# Patient Record
Sex: Female | Born: 1995 | Hispanic: Yes | Smoking: Former smoker
Health system: Southern US, Community
[De-identification: ages and names within clinical notes are randomized; demographics above are authoritative.]

## PROBLEM LIST (undated history)

## (undated) ENCOUNTER — Inpatient Hospital Stay (HOSPITAL_COMMUNITY): Payer: Self-pay

## (undated) DIAGNOSIS — F99 Mental disorder, not otherwise specified: Secondary | ICD-10-CM

## (undated) DIAGNOSIS — F329 Major depressive disorder, single episode, unspecified: Secondary | ICD-10-CM

## (undated) DIAGNOSIS — R51 Headache: Secondary | ICD-10-CM

## (undated) DIAGNOSIS — F32A Depression, unspecified: Secondary | ICD-10-CM

## (undated) DIAGNOSIS — E119 Type 2 diabetes mellitus without complications: Secondary | ICD-10-CM

## (undated) DIAGNOSIS — E669 Obesity, unspecified: Secondary | ICD-10-CM

## (undated) DIAGNOSIS — H18602 Keratoconus, unspecified, left eye: Secondary | ICD-10-CM

## (undated) DIAGNOSIS — F419 Anxiety disorder, unspecified: Secondary | ICD-10-CM

## (undated) DIAGNOSIS — R519 Headache, unspecified: Secondary | ICD-10-CM

---
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
## 2013-10-06 ENCOUNTER — Emergency Department (HOSPITAL_COMMUNITY)
Admission: EM | Admit: 2013-10-06 | Discharge: 2013-10-06 | Disposition: A | Discharge status: Home / Self Care | Payer: Medicaid Other | Attending: Emergency Medicine | Admitting: Emergency Medicine

## 2013-10-06 ENCOUNTER — Encounter (HOSPITAL_COMMUNITY): Payer: Self-pay | Admitting: Emergency Medicine

## 2013-10-06 DIAGNOSIS — F329 Major depressive disorder, single episode, unspecified: Secondary | ICD-10-CM | POA: Diagnosis not present

## 2013-10-06 DIAGNOSIS — F411 Generalized anxiety disorder: Secondary | ICD-10-CM | POA: Diagnosis not present

## 2013-10-06 DIAGNOSIS — Z79899 Other long term (current) drug therapy: Secondary | ICD-10-CM | POA: Insufficient documentation

## 2013-10-06 DIAGNOSIS — L600 Ingrowing nail: Secondary | ICD-10-CM | POA: Insufficient documentation

## 2013-10-06 DIAGNOSIS — M79609 Pain in unspecified limb: Secondary | ICD-10-CM | POA: Diagnosis present

## 2013-10-06 DIAGNOSIS — L089 Local infection of the skin and subcutaneous tissue, unspecified: Secondary | ICD-10-CM

## 2013-10-06 DIAGNOSIS — F3289 Other specified depressive episodes: Secondary | ICD-10-CM | POA: Insufficient documentation

## 2013-10-06 DIAGNOSIS — E669 Obesity, unspecified: Secondary | ICD-10-CM | POA: Diagnosis not present

## 2013-10-06 MED ORDER — CEPHALEXIN 500 MG PO CAPS: 500.0000 mg | ORAL_CAPSULE | Freq: Four times a day (QID) | ORAL | Status: DC

## 2013-10-06 NOTE — ED Provider Notes (Signed)
CSN: [id number]     Arrival date & time 10/06/13  1124 History  This chart was scribed for non-physician practitioner, Rhea Bleacher, PA-C working with Flint Melter, MD by Greggory Stallion, ED scribe. This patient was seen in room TR06C/TR06C and the patient's care was started at 11:58 AM.   Chief Complaint  Patient presents with  . Toe Pain   The history is provided by the patient. No language interpreter was used.   HPI Comments: [name] is a 18 y.o. female who presents to the Emergency Department complaining of worsening right big toe pain with associated swelling and redness that started 3 months ago. States she thought she had an ingrown nail and tried to cut it out but cut her foot instead. Reports pus drainage from the area. Bearing weight worsens the pain.   Past Medical History  Diagnosis Date  . Mental disorder   . Anxiety   . Depression   . Obesity    Past Surgical History  Procedure Laterality Date  . Wisdom tooth extraction  Oct 2012    all four   Family History  Problem Relation Age of Onset  . Bipolar disorder Maternal Grandmother    History  Substance Use Topics  . Smoking status: Never Smoker   . Smokeless tobacco: Not on file  . Alcohol Use: No   OB History   Grav Para Term Preterm Abortions TAB SAB Ect Mult Living                 Review of Systems  Constitutional: Negative for fever.  HENT: Negative for congestion.   Eyes: Negative for redness.  Respiratory: Negative for shortness of breath.   Cardiovascular: Negative for chest pain.  Gastrointestinal: Negative for abdominal distention.  Musculoskeletal: Positive for arthralgias and joint swelling.  Skin: Positive for color change.  Neurological: Negative for speech difficulty.  Psychiatric/Behavioral: Negative for confusion.   Allergies  Clindamycin/lincomycin  Home Medications   Prior to Admission medications   Medication Sig Start Date End Date Taking? Authorizing Provider   buPROPion (WELLBUTRIN XL) 300 MG 24 hr tablet Take 300 mg by mouth daily.    Historical Provider, MD  FLUoxetine (PROZAC) 20 MG capsule Take 20 mg by mouth daily.    Historical Provider, MD  ondansetron (ZOFRAN) 4 MG tablet Take 1 tablet (4 mg total) by mouth every 6 (six) hours. 05/09/12   Trixie Dredge, PA-C   BP 112/61  Pulse 86  Temp(Src) 98.4 F (36.9 C) (Oral)  Resp 15  Ht  (1.651 m)  Wt 176 lb (79.833 kg)  BMI 29.29 kg/m2  SpO2 100%  LMP 09/06/2013  Physical Exam  Nursing note and vitals reviewed. Constitutional: She is oriented to person, place, and time. She appears well-developed and well-nourished. No distress.  HENT:  Head: Normocephalic and atraumatic.  Eyes: Conjunctivae and EOM are normal.  Neck: Neck supple. No tracheal deviation present.  Cardiovascular: Normal rate.   Pulmonary/Chest: Effort normal. No respiratory distress.  Musculoskeletal: Normal range of motion.  Mild erythema and redness of medial nailfold, no paronychia. No active drainage. Toenail appears ingrown.   Neurological: She is alert and oriented to person, place, and time.  Skin: Skin is warm and dry.  Psychiatric: She has a normal mood and affect. Her behavior is normal.    ED Course  Procedures (including critical care time)  DIAGNOSTIC STUDIES: Oxygen Saturation is 100% on RA, normal by my interpretation.    COORDINATION OF  CARE: 12:00 PM-Discussed treatment plan which includes an antibiotic with pt at bedside and pt agreed to plan. Will give pt a podiatry referral and advised her to follow up for excision.   Labs Review Labs Reviewed - No data to display  Imaging Review No results found.   EKG Interpretation None      Vital signs reviewed and are as follows: Filed Vitals:   10/06/13 1145  BP: 112/61  Pulse: 86  Temp: 98.4 F (36.9 C)  Resp: 15   12:33 PM Pt urged to return with worsening pain, worsening swelling, expanding area of redness or streaking up extremity,  fever, or any other concerns. Urged to take complete course of antibiotics as prescribed. Pt verbalizes understanding and agrees with plan.   MDM   Final diagnoses:  Ingrown toenail  Toe infection   Patient with infection due to ingrown toenail. Will defer excision to podiatry. Will start on Keflex for skin infection. Infection is mild. Toe is neurovascularly intact.  I personally performed the services described in this documentation, which was scribed in my presence. The recorded information has been reviewed and is accurate.  Renne Crigler, PA-C 10/06/13 1233

## 2013-10-06 NOTE — ED Provider Notes (Signed)
Medical screening examination/treatment/procedure(s) were performed by non-physician practitioner and as supervising physician I was immediately available for consultation/collaboration.   EKG Interpretation None       Flint Melter, MD 10/06/13 2021

## 2013-10-06 NOTE — ED Notes (Signed)
Right great toe pain x 3 months. States has been trying to "cut it out".

## 2013-10-06 NOTE — Discharge Instructions (Signed)
Please read and follow all provided instructions.  Your diagnoses today include:  1. Ingrown toenail   2. Toe infection    Tests performed today include:  Vital signs. See below for your results today.   Medications prescribed:   Keflex (cephalexin) - antibiotic  You have been prescribed an antibiotic medicine: take the entire course of medicine even if you are feeling better. Stopping early can cause the antibiotic not to work.  Take any prescribed medications only as directed.   Home care instructions:  Follow any educational materials contained in this packet. Keep affected area above the level of your heart when possible. Wash area gently twice a day with warm soapy water. Do not apply alcohol or hydrogen peroxide. Cover the area if it draining or weeping.   Follow-up instructions: Please follow-up with your primary care provider or podiatry referral in the next 1 week for further evaluation of your symptoms.   Return instructions:  Return to the Emergency Department if you have:  Fever  Worsening symptoms  Worsening pain  Worsening swelling  Redness of the skin that moves away from the affected area, especially if it streaks away from the affected area   Any other emergent concerns  Your vital signs today were: BP 112/61   Pulse 86   Temp(Src) 98.4 F (36.9 C) (Oral)   Resp 15   Ht  (1.651 m)   Wt 176 lb (79.833 kg)   BMI 29.29 kg/m2   SpO2 100%   LMP 09/06/2013 If your blood pressure (BP) was elevated above 135/85 this visit, please have this repeated by your doctor within one month. --------------

## 2013-12-21 ENCOUNTER — Encounter: Payer: Self-pay | Admitting: Podiatry

## 2013-12-21 ENCOUNTER — Ambulatory Visit (INDEPENDENT_AMBULATORY_CARE_PROVIDER_SITE_OTHER): Payer: Self-pay | Admitting: Podiatry

## 2013-12-21 VITALS — BP 108/70 | HR 80 | Resp 12

## 2013-12-21 DIAGNOSIS — L03011 Cellulitis of right finger: Secondary | ICD-10-CM

## 2013-12-21 DIAGNOSIS — L03039 Cellulitis of unspecified toe: Secondary | ICD-10-CM

## 2013-12-21 MED ORDER — CEPHALEXIN 500 MG PO CAPS: 500.0000 mg | ORAL_CAPSULE | Freq: Three times a day (TID) | ORAL | Status: DC

## 2013-12-21 NOTE — Progress Notes (Signed)
Subjective:    Patient ID: [name], female    DOB: 03/02/1995, 18 y.o.   MRN: [id number]  HPI 18 year old female presents the office today with her mother for complaints of right leg toe ingrown toenail and infection. The patient states that she has had recurrent infections over the last 6 months for which she has been on an antibiotic although she is unsure which one. She is also been applying Neosporin over the area. She states that she has tenderness directly over the nail border particularly with pressure in shoe gear. The patient is unable to wear shoes due to the pain. Denies any systemic complaints as fevers, chills, nausea, vomiting. Not currently on any antibiotics. No other complaints at this time.  Review of Systems  Constitutional: Positive for appetite change and unexpected weight change.  HENT: Positive for sinus pressure and sneezing.   Eyes: Positive for itching and visual disturbance.  Allergic/Immunologic: Positive for environmental allergies and food allergies.  Psychiatric/Behavioral: Positive for hallucinations and behavioral problems. The patient is nervous/anxious.        Objective:   Physical Exam AAO x3, NAD DP/PT pulses palpable bilaterally, CRT less than 3 seconds Protective sensation intact with Simms Weinstein monofilament, vibratory sensation intact, Achilles tendon reflex intact Tenderness palpation over the medial aspect of the right hallux nail. There is overlying edema, erythema over this area. There is evidence of some dried blood within the nail border. There is no ascending cellulitis. No areas of fluctuance or crepitus. There is no areas of pinpoint bony tenderness or pain with vibratory sensation. MMT 5/5, ROM WNL No calf pain with compression, swelling, warmth, erythema. No open lesions.       Assessment & Plan:  18 year old female with a right medial hallux nail border paronychia. -Treatment options discussed including alternatives,  risks, complications. -At this time due to infection in the medial border of the right hallux nail recommend partial nail avulsion. Risks and complications were discussed with the patient and her mother for which they both understood and the patient wishes to proceed with the procedure and verbal consent was obtained. Under sterile conditions a total of 2.5 mL of a one-to-one mixture of 2% lidocaine plain and 0.5% Marcaine plain was infiltrated in a hallux block fashion on the right foot. Once anesthetized the skin was then prepped in a sterile fashion. A tourniquet was then applied. Next the medial border of the right hallux nail was sharply excised mixture to remove the entire offending nail border. There is noted to be a large amount of ingrowing within the knee medial nail border. There is noted to be a small amount of purulence identified to the initial procedure. Once the nail was removed the underlying skin was intact, and no further purulence was identified. The area was then irrigated. Silvadene was applied followed by a dry sterile dressing. After application a dressing the tourniquet was removed and there is noted to be an immediate capillary refill time to the digit. The patient tolerated the procedure well without any complications. -Post procedure instructions were discussed with the patient for which she verbally understood. Written instructions were also provided. -Prescribed Keflex. -Monitor for any clinical signs or symptoms of worsening infection and directed to call the office immediately if any are to occur or go directly to the emergency room. -Follow-up in one week. In the meantime call the office with any questions, concerns, change in symptoms. Discussed with the patient that if she has any recurrence of  pain or if she wishes to proceed with continued with chemical matricectomy to help prevent recurrence once the infection clears.

## 2013-12-21 NOTE — Patient Instructions (Signed)
Betadine Soak Instructions  Purchase an 8 oz. bottle of BETADINE solution (Povidone)  THE DAY AFTER THE PROCEDURE  Place 1 tablespoon of betadine solution in a quart of warm tap water.  Submerge your foot or feet with outer bandage intact for the initial soak; this will allow the bandage to become moist and wet for easy lift off.  Once you remove your bandage, continue to soak in the solution for 20 minutes.  This soak should be done twice a day.  Next, remove your foot or feet from solution, blot dry the affected area and cover.  You may use a band aid large enough to cover the area or use gauze and tape.  Apply other medications to the area as directed by the doctor such as cortisporin otic solution (ear drops) or neosporin.  IF YOUR SKIN BECOMES IRRITATED WHILE USING THESE INSTRUCTIONS, IT IS OKAY TO SWITCH TO EPSOM SALTS AND WATER OR WHITE VINEGAR AND WATER.  Monitor for any signs/symptoms of infection. Call the office immediately if any occur or go directly to the emergency room. Call with any questions/concerns.  

## 2014-01-06 ENCOUNTER — Ambulatory Visit: Payer: Self-pay | Admitting: Podiatry

## 2014-02-03 HISTORY — PX: CORNEAL TRANSPLANT: SHX108

## 2015-04-04 ENCOUNTER — Emergency Department (HOSPITAL_COMMUNITY): Payer: Medicaid Other

## 2015-04-04 ENCOUNTER — Emergency Department (HOSPITAL_COMMUNITY)
Admission: EM | Admit: 2015-04-04 | Discharge: 2015-04-04 | Disposition: A | Discharge status: Home / Self Care | Payer: Medicaid Other | Attending: Physician Assistant | Admitting: Physician Assistant

## 2015-04-04 ENCOUNTER — Encounter (HOSPITAL_COMMUNITY): Payer: Self-pay | Admitting: Family Medicine

## 2015-04-04 DIAGNOSIS — E669 Obesity, unspecified: Secondary | ICD-10-CM | POA: Diagnosis not present

## 2015-04-04 DIAGNOSIS — Z8659 Personal history of other mental and behavioral disorders: Secondary | ICD-10-CM | POA: Diagnosis not present

## 2015-04-04 DIAGNOSIS — Z3202 Encounter for pregnancy test, result negative: Secondary | ICD-10-CM | POA: Insufficient documentation

## 2015-04-04 DIAGNOSIS — R112 Nausea with vomiting, unspecified: Secondary | ICD-10-CM | POA: Insufficient documentation

## 2015-04-04 DIAGNOSIS — J069 Acute upper respiratory infection, unspecified: Secondary | ICD-10-CM | POA: Diagnosis not present

## 2015-04-04 DIAGNOSIS — R509 Fever, unspecified: Secondary | ICD-10-CM | POA: Diagnosis present

## 2015-04-04 DIAGNOSIS — R110 Nausea: Secondary | ICD-10-CM

## 2015-04-04 DIAGNOSIS — R059 Cough, unspecified: Secondary | ICD-10-CM

## 2015-04-04 DIAGNOSIS — R05 Cough: Secondary | ICD-10-CM

## 2015-04-04 LAB — RAPID STREP SCREEN (MED CTR MEBANE ONLY): STREPTOCOCCUS, GROUP A SCREEN (DIRECT): NEGATIVE

## 2015-04-04 LAB — URINALYSIS, ROUTINE W REFLEX MICROSCOPIC
Bilirubin Urine: NEGATIVE
Glucose, UA: NEGATIVE mg/dL
Hgb urine dipstick: NEGATIVE
Ketones, ur: NEGATIVE mg/dL
Nitrite: NEGATIVE
Protein, ur: NEGATIVE mg/dL
Specific Gravity, Urine: 1.017 (ref 1.005–1.030)
pH: 8.5 — ABNORMAL HIGH (ref 5.0–8.0)

## 2015-04-04 LAB — CBC
HCT: 39.8 % (ref 36.0–46.0)
HEMOGLOBIN: 13.2 g/dL (ref 12.0–15.0)
MCH: 29.1 pg (ref 26.0–34.0)
MCHC: 33.2 g/dL (ref 30.0–36.0)
MCV: 87.9 fL (ref 78.0–100.0)
Platelets: 278 10*3/uL (ref 150–400)
RBC: 4.53 MIL/uL (ref 3.87–5.11)
RDW: 12.6 % (ref 11.5–15.5)
WBC: 6.2 10*3/uL (ref 4.0–10.5)

## 2015-04-04 LAB — COMPREHENSIVE METABOLIC PANEL
ALK PHOS: 57 U/L (ref 38–126)
ALT: 20 U/L (ref 14–54)
ANION GAP: 9 (ref 5–15)
AST: 18 U/L (ref 15–41)
Albumin: 3.7 g/dL (ref 3.5–5.0)
BUN: <5 mg/dL — ABNORMAL LOW (ref 6–20)
CALCIUM: 8.8 mg/dL — AB (ref 8.9–10.3)
CHLORIDE: 106 mmol/L (ref 101–111)
CO2: 25 mmol/L (ref 22–32)
Creatinine, Ser: 0.55 mg/dL (ref 0.44–1.00)
GFR calc non Af Amer: >60 mL/min (ref >60)
Glucose, Bld: 100 mg/dL — ABNORMAL HIGH (ref 65–99)
Potassium: 3.9 mmol/L (ref 3.5–5.1)
SODIUM: 140 mmol/L (ref 135–145)
Total Bilirubin: 0.1 mg/dL — ABNORMAL LOW (ref 0.3–1.2)
Total Protein: 7.1 g/dL (ref 6.5–8.1)

## 2015-04-04 LAB — I-STAT BETA HCG BLOOD, ED (MC, WL, AP ONLY): I-stat hCG, quantitative: <5 m[IU]/mL (ref <5)

## 2015-04-04 LAB — URINE MICROSCOPIC-ADD ON

## 2015-04-04 MED ORDER — ONDANSETRON 4 MG PO TBDP: 4.0000 mg | ORAL_TABLET | Freq: Three times a day (TID) | ORAL | Status: DC | PRN

## 2015-04-04 MED ORDER — SODIUM CHLORIDE 0.9 % IV BOLUS (SEPSIS)
1000.0000 mL | Freq: Once | INTRAVENOUS | Status: AC
  Administered 2015-04-04: 1000 mL via INTRAVENOUS

## 2015-04-04 MED ORDER — ONDANSETRON 4 MG PO TBDP
4.0000 mg | ORAL_TABLET | Freq: Once | ORAL | Status: AC | PRN
  Administered 2015-04-04: 4 mg via ORAL

## 2015-04-04 MED ORDER — ONDANSETRON 4 MG PO TBDP
ORAL_TABLET | ORAL | Status: AC
  Filled 2015-04-04: qty 1

## 2015-04-04 MED ORDER — KETOROLAC TROMETHAMINE 30 MG/ML IJ SOLN
30.0000 mg | Freq: Once | INTRAMUSCULAR | Status: AC
  Administered 2015-04-04: 30 mg via INTRAVENOUS
  Filled 2015-04-04: qty 1

## 2015-04-04 NOTE — ED Provider Notes (Signed)
CSN: [id number]     Arrival date & time 04/04/15  1008 History   First MD Initiated Contact with Patient 04/04/15 1239     Chief Complaint  Patient presents with  . Nausea  . Emesis  . Fever    HPI   [name] is a 20 y.o. female with a PMH of anxiety and depression who presents to the ED with fever, chills, nasal congestion, productive cough, and generalized body aches. She states her symptoms started Monday and have been constant since that time. She denies exacerbating factors. She notes she has tried OTC cough medication with no significant symptom relief. She reports sick contact, and states her family members have experienced similar symptoms. She also reports nausea. She denies abdominal pain, vomiting, diarrhea, constipation, dysuria, urgency, frequency.   Past Medical History  Diagnosis Date  . Mental disorder   . Anxiety   . Depression   . Obesity    Past Surgical History  Procedure Laterality Date  . Wisdom tooth extraction  Oct 2012    all four   Family History  Problem Relation Age of Onset  . Bipolar disorder Maternal Grandmother    Social History  Substance Use Topics  . Smoking status: Never Smoker   . Smokeless tobacco: None  . Alcohol Use: No   OB History    No data available      Review of Systems  Constitutional: Positive for fever and chills.  HENT: Positive for congestion.   Respiratory: Positive for cough.   Gastrointestinal: Positive for nausea. Negative for vomiting, abdominal pain, diarrhea, constipation and blood in stool.  Genitourinary: Negative for dysuria, urgency and frequency.  All other systems reviewed and are negative.     Allergies  Clindamycin/lincomycin  Home Medications   Prior to Admission medications   Medication Sig Start Date End Date Taking? Authorizing Provider  acetaminophen (TYLENOL) 325 MG tablet Take 650 mg by mouth every 6 (six) hours as needed for mild pain.   Yes Historical Provider, MD  cephALEXin  (KEFLEX) 500 MG capsule Take 1 capsule (500 mg total) by mouth 4 (four) times daily. Patient not taking: Reported on 04/04/2015 10/06/13   Renne Crigler, PA-C  cephALEXin (KEFLEX) 500 MG capsule Take 1 capsule (500 mg total) by mouth 3 (three) times daily. Patient not taking: Reported on 04/04/2015 12/21/13   Vivi Barrack, DPM  ondansetron (ZOFRAN ODT) 4 MG disintegrating tablet Take 1 tablet (4 mg total) by mouth every 8 (eight) hours as needed for nausea. 04/04/15   Mady Gemma, PA-C  ondansetron (ZOFRAN) 4 MG tablet Take 1 tablet (4 mg total) by mouth every 6 (six) hours. Patient not taking: Reported on 04/04/2015 05/09/12   Trixie Dredge, PA-C    BP 125/77 mmHg  Pulse 106  Temp(Src) 99.1 F (37.3 C) (Oral)  Resp 18  SpO2 99%  LMP 03/21/2015 Physical Exam  Constitutional: She is oriented to person, place, and time. She appears well-developed and well-nourished. No distress.  HENT:  Head: Normocephalic and atraumatic.  Right Ear: External ear normal.  Left Ear: External ear normal.  Nose: Nose normal.  Mouth/Throat: Uvula is midline, oropharynx is clear and moist and mucous membranes are normal. No oropharyngeal exudate, posterior oropharyngeal edema, posterior oropharyngeal erythema or tonsillar abscesses.  Eyes: Conjunctivae, EOM and lids are normal. Pupils are equal, round, and reactive to light. Right eye exhibits no discharge. Left eye exhibits no discharge. No scleral icterus.  Neck: Normal range of motion.  Neck supple.  Cardiovascular: Normal rate, regular rhythm, normal heart sounds, intact distal pulses and normal pulses.   Pulmonary/Chest: Effort normal and breath sounds normal. No respiratory distress. She has no wheezes. She has no rales.  Abdominal: Soft. Normal appearance and bowel sounds are normal. She exhibits no distension and no mass. There is no tenderness. There is no rigidity, no rebound and no guarding.  Musculoskeletal: Normal range of motion. She exhibits no  edema or tenderness.  Neurological: She is alert and oriented to person, place, and time. She has normal strength. No sensory deficit.  Skin: Skin is warm, dry and intact. No rash noted. She is not diaphoretic. No erythema. No pallor.  Psychiatric: She has a normal mood and affect. Her speech is normal and behavior is normal.  Nursing note and vitals reviewed.   ED Course  Procedures (including critical care time)  Labs Review Labs Reviewed  COMPREHENSIVE METABOLIC PANEL - Abnormal; Notable for the following:    Glucose, Bld 100 (*)    BUN <5 (*)    Calcium 8.8 (*)    Total Bilirubin 0.1 (*)    All other components within normal limits  URINALYSIS, ROUTINE W REFLEX MICROSCOPIC (NOT AT [facility]) - Abnormal; Notable for the following:    pH 8.5 (*)    Leukocytes, UA TRACE (*)    All other components within normal limits  URINE MICROSCOPIC-ADD ON - Abnormal; Notable for the following:    Squamous Epithelial / LPF 0-5 (*)    Bacteria, UA FEW (*)    All other components within normal limits  RAPID STREP SCREEN (NOT AT [facility])  CULTURE, GROUP A STREP [facility])  CBC  I-STAT BETA HCG BLOOD, ED ([facility], [facility], [facility] ONLY)    Imaging Review Dg Chest 2 View  04/04/2015  CLINICAL DATA:  Fever, cough, shortness of breath and body aches for 2 days EXAM: CHEST  2 VIEW COMPARISON:  05/04/2005 FINDINGS: Mild peribronchial thickening. Heart and mediastinal contours are within normal limits. No focal opacities or effusions. No acute bony abnormality. IMPRESSION: Mild bronchitic changes. Electronically Signed   By: Charlett Nose M.D.   On: 04/04/2015 10:52   I have personally reviewed and evaluated these images and lab results as part of my medical decision-making.   EKG Interpretation None      MDM   Final diagnoses:  URI (upper respiratory infection)  Nausea    20 year old female presents with fever, chills, nasal congestion, productive cough, and generalized body aches since Monday.  Patient is  afebrile. HR 106. Posterior oropharynx without significant erythema, edema, or exudate. Heart RRR. Lungs clear bilaterally. Abdomen soft, non-tender, non-distended. No rebound, guarding, or masses.  CBC negative for leukocytosis or anemia. CMP unremarkable. UA negative for infection. Beta hCG negative.  Rapid strep negative. CXR remarkable for mild bronchitis changes.   Patient given fluids and toradol. On reassessment, she reports symptom improvement. Patient is non-toxic and well-appearing, feel she is stable for discharge at this time. Symptoms likely viral. Advised to take over-the-counter cough medication and to use tylenol or motrin for body aches. Will give zofran for home for nausea. Patient to follow up with PCP. Return precautions discussed. Patient verbalizes her understanding and is in agreement with plan.  BP 125/77 mmHg  Pulse 106  Temp(Src) 99.1 F (37.3 C) (Oral)  Resp 18  SpO2 99%  LMP 03/21/2015     Mady Gemma, PA-C 04/04/15 1555  Courteney Randall An, MD 04/04/15 1616

## 2015-04-04 NOTE — Discharge Instructions (Signed)
1. Medications: zofran for nausea, OTC cough medication, tylenol or motrin for pain, usual home medications 2. Treatment: rest, drink plenty of fluids 3. Follow Up: please followup with your primary doctor for discussion of your diagnoses and further evaluation after today's visit; please return to the ER for new or worsening symptoms   Upper Respiratory Infection, Adult Most upper respiratory infections (URIs) are caused by a virus. A URI affects the nose, throat, and upper air passages. The most common type of URI is often called "the common cold." HOME CARE   Take medicines only as told by your doctor.  Gargle warm saltwater or take cough drops to comfort your throat as told by your doctor.  Use a warm mist humidifier or inhale steam from a shower to increase air moisture. This may make it easier to breathe.  Drink enough fluid to keep your pee (urine) clear or pale yellow.  Eat soups and other clear broths.  Have a healthy diet.  Rest as needed.  Go back to work when your fever is gone or your doctor says it is okay.  You may need to stay home longer to avoid giving your URI to others.  You can also wear a face mask and wash your hands often to prevent spread of the virus.  Use your inhaler more if you have asthma.  Do not use any tobacco products, including cigarettes, chewing tobacco, or electronic cigarettes. If you need help quitting, ask your doctor. GET HELP IF:  You are getting worse, not better.  Your symptoms are not helped by medicine.  You have chills.  You are getting more short of breath.  You have brown or red mucus.  You have yellow or brown discharge from your nose.  You have pain in your face, especially when you bend forward.  You have a fever.  You have puffy (swollen) neck glands.  You have pain while swallowing.  You have white areas in the back of your throat. GET HELP RIGHT AWAY IF:   You have very bad or  constant:  Headache.  Ear pain.  Pain in your forehead, behind your eyes, and over your cheekbones (sinus pain).  Chest pain.  You have long-lasting (chronic) lung disease and any of the following:  Wheezing.  Long-lasting cough.  Coughing up blood.  A change in your usual mucus.  You have a stiff neck.  You have changes in your:  Vision.  Hearing.  Thinking.  Mood. MAKE SURE YOU:   Understand these instructions.  Will watch your condition.  Will get help right away if you are not doing well or get worse.   This information is not intended to replace advice given to you by your health care provider. Make sure you discuss any questions you have with your health care provider.   Document Released: 07/09/2007 Document Revised: 06/06/2014 Document Reviewed: 04/27/2013 Elsevier Interactive Patient Education 2016 [employer].   Emergency Department Resource Guide 1) Find a Doctor and Pay Out of Pocket Although you won't have to find out who is covered by your insurance plan, it is a good idea to ask around and get recommendations. You will then need to call the office and see if the doctor you have chosen will accept you as a new patient and what types of options they offer for patients who are self-pay. Some doctors offer discounts or will set up payment plans for their patients who do not have insurance, but you will need  to ask so you aren't surprised when you get to your appointment.  2) Contact Your Local Health Department Not all health departments have doctors that can see patients for sick visits, but many do, so it is worth a call to see if yours does. If you don't know where your local health department is, you can check in your phone book. The CDC also has a tool to help you locate your state's health department, and many state websites also have listings of all of their local health departments.  3) Find a Walk-in Clinic If your illness is not likely to be  very severe or complicated, you may want to try a walk in clinic. These are popping up all over the country in pharmacies, drugstores, and shopping centers. They're usually staffed by nurse practitioners or physician assistants that have been trained to treat common illnesses and complaints. They're usually fairly quick and inexpensive. However, if you have serious medical issues or chronic medical problems, these are probably not your best option.  No Primary Care Doctor: - Call Health Connect at  [phone] - they can help you locate a primary care doctor that  accepts your insurance, provides certain services, etc. - Physician Referral Service- [phone]  Chronic Pain Problems: Organization         Address  Phone   Notes  [facility]  [phone] Patients need to be referred by their primary care doctor.   Medication Assistance: Organization         Address  Phone   Notes  [facility] Medication [facility] [address] [place], [place] [address] [phone] --Must be a resident of [place] -- Must have NO insurance coverage whatsoever (no Medicaid/ Medicare, etc.) -- The pt. MUST have a primary care doctor that directs their care regularly and follows them in the community   [facility]  [phone]   [employer]  [phone]    Agencies that provide inexpensive medical care: Organization         Address  Phone   Notes  [facility]  [phone]   [facility]    [phone]   [facility] [address] [place], [place] [address] [phone]   [facility] [address] [place] [phone]   Planned Parenthood    [phone]   [facility]    [phone]   [facility]  [address], [place] Phone:  [phone], Fax:  [phone] Hours of Operation:  9 am - 6 pm, M-F.  Also  accepts Medicaid/Medicare and self-pay.  [facility]  [address], [place] Phone: [phone], Fax: [phone]. Hours of Operation:  8:30 am - 5:30 pm, M-F.  Also accepts Medicaid and self-pay.  [facility] [place] [address], [place] Phone: [phone]   [facility] [address] Natasha Bence [place], [place] [phone], Ext. [phone] Mondays & Thursdays: 7-9 AM.  First 15 patients are seen on a first come, first serve basis.    Medicaid-accepting [facility] Providers:  Organization         Address  Phone   Notes  [facility] [address], [place] [phone] Also accepts self-pay patients.  [facility] [address] [place], [address]  (  [phone]   [facility] [address], [place] [phone]   [facility] [address], [place] [phone]   Renaye Rakers [address], [place]   [phone] Only accepts [place] Access [place] patients after they have their name applied to their card.   Self-Pay (no insurance) in [facility] Cl[facility]:  Organization         Address  Phone   Notes  Sickle Cell Patients, [facility] [address] [place], [place] [phone]   [facility]gent Care [address] [place], [place] [phone]   [facility] [place]  [address], [place] [phone]   [facility]/Dr. Osei-Bonsu  [address], [place] or [address], [place] [phone] Phone number for both [place] and [place] locations is the same.  [facility] [address], [place] [phone]   [facility] [address] [place] or [address] [phone]   [facility] [address]  [place] [phone], phone; [phone], fax Sees patients 1st and 3rd Saturday of every month.  Must not qualify for public or private insurance (i.e. Medicaid, Medicare, [place] Health Choice, Veterans' Benefits)  Household income should be no more than 200% of the poverty level The clinic cannot treat you if you are pregnant or think you are pregnant  Sexually transmitted diseases are not treated at the clinic.    Dental Care: Organization         Address  Phone  Notes  [facility] Department of [facility] [address] [place], [place] [phone] Accepts children up to age 54 who are enrolled in [place] or [place] Health Choice; pregnant women with a Medicaid card; and children who have applied for Medicaid or [place] Health Choice, but were declined, whose parents can pay a reduced fee at time of service.  [facility] Department of [facility]  [address], [place] [phone] Accepts children up to age 34 who are enrolled in [place] or  Health Choice; pregnant women with a Medicaid card; and children who have applied for Medicaid or  Health Choice, but were declined, whose parents can pay a reduced fee at time of service.  [facility]  [address] [place], [place] [phone] Patients are seen by appointment only. Walk-ins are not accepted. [facility] will see patients 20 years of age and older. Monday - Tuesday (8am-5pm) Most Wednesdays (8:30-5pm) $30 per visit, cash only  [facility] Adult Dental Access PRO[facility]  [address], [facility] [phone] Patients are seen by appointment only. Walk-ins are not accepted. [facility] will see patients 95 years of age and older. One Wednesday Evening (Monthly: Volunteer Based).  $30 per visit, cash only  [employer]  [phone] for adults; Children under age 61, call Graduate Pediatric Dentistry at [phone].  Children aged 57-14, please call [phone] to request a pediatric application.  Dental services are provided in all areas of dental care including fillings, crowns and bridges, complete and partial dentures, implants, gum treatment, root canals, and extractions. Preventive care is also provided. Treatment is provided to both adults and children. Patients are selected via a lottery and there is  often a waiting list.   [facility] [address], [place]  [phone] [email or web address]   [facility] [address] [place], [place] [phone], Ext. [phone] Second and Fourth Thursday of each month, opens at 6:30 AM; Clinic ends at 9 AM.  Patients are seen on a first-come first-served basis, and a limited number are seen during each clinic.   [facility]  [address] [place], [place] [phone]   Eligibility Requirements You must have lived in [place], [place], or [place] counties for at least the last three months.   You cannot be eligible for state or federal sponsored [place], including [employer], [place], or [employer].   You generally cannot be eligible for healthcare insurance through your employer.    How to apply: Eligibility screenings are held every Tuesday and Wednesday afternoon from 1:00 pm until 4:00 pm. You do not need an appointment for the interview!  [facility] [address] [place], [place] [phone]   [facility] Health Department  [phone]   [facility] Health Department  [phone]   [facility] Health Department  [phone]    Behavioral Health Resources in the Community: Intensive Outpatient Programs Organization         Address  Phone  Notes  [facility] [address] [place], [place] [phone]   [facility]patient [address], [place], [place] [phone]   [facility] [address] [place], [place]  [phone]   [facility] Mental Health [address],  [place], [place] [phone] or [phone]   Substance Abuse Resources Organization         Address  Phone  Notes  [facility]  [phone]   [facility]  [phone]   [facility]  [phone]   Floydene Flock  [phone]   Residential & Outpatient Substance Abuse Program  [phone]   Psychological Services Organization         Address  Phone  Notes  [facility] Behavioral Health  [phone]   [facility] Services  [phone]   [facility] [address] [place] [phone] or [phone]    Mobile Crisis Teams Organization         Address  Phone  Notes  [facility], Mobile Crisis Care Unit  [phone]   [facility]  [address] [place], [place] [phone]   Doristine Locks [address] [place] [phone]    Self-Help/Support Groups Organization         Address  Phone             Notes  [facility] [place] - variety of support groups  336- I7437963 Call for more information  Narcotics Anonymous (NA), [facility] [address], [employer] [place]  2 meetings at this location   Statistician         Address  Phone  Notes  [facility] [address],    [place]  [phone]   [facility]  [address], [place] [address], [place], [place] [phone]   [facility] [address] [place], [place] [phone] Admissions: 8am-3pm M-F  [facility] [address]    [place], [place] [phone]   [facility] [address] [place], [place], [place] [phone]  [facility] [address],  [place], [place] [phone]   [facility] - Intensive Outpatient [address], [place], [place] [phone]    [facility] (Addiction Recovery Care [facility] [address] [place]  [place], [place] [phone] or [phone]   [facility] ([facility]) [address] [place], [place] [phone] Accepts Medicaid  [facility] [address],  [place] [phone] Substance Abuse/Addiction Treatment   [facility] Organization         Address  Phone  Notes  [facility]  [phone]   Angie Fava, PhD [address] [place], [place]   [phone] or [phone]   [facility]   [address] [place], [place] [phone]   [facility] [address] [place], [place] [phone] Insurance/Medicaid/sponsorship through [facility] and Families [address]                                    [place], [place] [phone] Therapy/tele-psych/case  [facility] [address], [place] [phone]    Dr. Lolly Mustache  [phone]   [facility]  [employer] [facility] Dept. 1) [address], [place] 2) [address], [place] 3)  [address], [place] [phone]  [phone]   [facility] Child Abuse Hotline [phone]) 127-3667 or 9145299666 (After Hours)

## 2015-04-04 NOTE — ED Notes (Signed)
Pt here for fever, cough, congestion. sts taking meds with some relief. sts some diarrhea and nausea. sts dizzy.

## 2015-04-06 LAB — CULTURE, GROUP A STREP (THRC)

## 2017-01-13 ENCOUNTER — Other Ambulatory Visit: Payer: Self-pay

## 2017-01-13 ENCOUNTER — Emergency Department (HOSPITAL_COMMUNITY)
Admission: EM | Admit: 2017-01-13 | Discharge: 2017-01-13 | Disposition: A | Discharge status: Home / Self Care | Payer: Medicaid Other | Attending: Emergency Medicine | Admitting: Emergency Medicine

## 2017-01-13 ENCOUNTER — Encounter (HOSPITAL_COMMUNITY): Payer: Self-pay

## 2017-01-13 DIAGNOSIS — Z5321 Procedure and treatment not carried out due to patient leaving prior to being seen by health care provider: Secondary | ICD-10-CM | POA: Diagnosis not present

## 2017-01-13 DIAGNOSIS — N939 Abnormal uterine and vaginal bleeding, unspecified: Secondary | ICD-10-CM | POA: Diagnosis not present

## 2017-01-13 DIAGNOSIS — M549 Dorsalgia, unspecified: Secondary | ICD-10-CM | POA: Insufficient documentation

## 2017-01-13 LAB — I-STAT BETA HCG BLOOD, ED (MC, WL, AP ONLY): I-stat hCG, quantitative: >2000 m[IU]/mL — ABNORMAL HIGH (ref <5)

## 2017-01-13 LAB — URINALYSIS, ROUTINE W REFLEX MICROSCOPIC
BILIRUBIN URINE: NEGATIVE
Glucose, UA: NEGATIVE mg/dL
Hgb urine dipstick: NEGATIVE
KETONES UR: NEGATIVE mg/dL
Nitrite: NEGATIVE
Protein, ur: NEGATIVE mg/dL
Specific Gravity, Urine: 1.017 (ref 1.005–1.030)
pH: 6 (ref 5.0–8.0)

## 2017-01-13 NOTE — ED Notes (Signed)
Lab contacted this RN regarding extra blood tubes that were sent down, this RN advised lab to please hold tubes until patient sees a provider in case more tests are ordered. Lab tech advised they would hold tubes til then.

## 2017-01-13 NOTE — ED Triage Notes (Signed)
Patient complains of missed period in December with back pain and light vaginal bleeding. States that she had positive preg test at home. No previous pregnancies. Alert and oriented

## 2017-01-14 ENCOUNTER — Inpatient Hospital Stay (HOSPITAL_COMMUNITY): Payer: Medicaid Other

## 2017-01-14 ENCOUNTER — Inpatient Hospital Stay (HOSPITAL_COMMUNITY)
Admission: AD | Admit: 2017-01-14 | Discharge: 2017-01-14 | Disposition: A | Discharge status: Home / Self Care | Payer: Medicaid Other | Source: Ambulatory Visit | Attending: Obstetrics and Gynecology | Admitting: Obstetrics and Gynecology

## 2017-01-14 ENCOUNTER — Encounter (HOSPITAL_COMMUNITY): Payer: Self-pay

## 2017-01-14 DIAGNOSIS — R109 Unspecified abdominal pain: Secondary | ICD-10-CM | POA: Diagnosis not present

## 2017-01-14 DIAGNOSIS — O2341 Unspecified infection of urinary tract in pregnancy, first trimester: Secondary | ICD-10-CM | POA: Diagnosis not present

## 2017-01-14 DIAGNOSIS — O26891 Other specified pregnancy related conditions, first trimester: Secondary | ICD-10-CM

## 2017-01-14 DIAGNOSIS — Z3A01 Less than 8 weeks gestation of pregnancy: Secondary | ICD-10-CM | POA: Diagnosis not present

## 2017-01-14 DIAGNOSIS — M549 Dorsalgia, unspecified: Secondary | ICD-10-CM

## 2017-01-14 DIAGNOSIS — O9989 Other specified diseases and conditions complicating pregnancy, childbirth and the puerperium: Secondary | ICD-10-CM

## 2017-01-14 DIAGNOSIS — O99891 Other specified diseases and conditions complicating pregnancy: Secondary | ICD-10-CM

## 2017-01-14 DIAGNOSIS — O26899 Other specified pregnancy related conditions, unspecified trimester: Secondary | ICD-10-CM

## 2017-01-14 DIAGNOSIS — R1030 Lower abdominal pain, unspecified: Secondary | ICD-10-CM | POA: Insufficient documentation

## 2017-01-14 HISTORY — DX: Headache: R51

## 2017-01-14 HISTORY — DX: Headache, unspecified: R51.9

## 2017-01-14 LAB — CBC
HCT: 38.3 % (ref 36.0–46.0)
Hemoglobin: 12.9 g/dL (ref 12.0–15.0)
MCH: 29.9 pg (ref 26.0–34.0)
MCHC: 33.7 g/dL (ref 30.0–36.0)
MCV: 88.9 fL (ref 78.0–100.0)
PLATELETS: 372 10*3/uL (ref 150–400)
RBC: 4.31 MIL/uL (ref 3.87–5.11)
RDW: 14 % (ref 11.5–15.5)
WBC: 10.3 10*3/uL (ref 4.0–10.5)

## 2017-01-14 LAB — URINALYSIS, ROUTINE W REFLEX MICROSCOPIC
Bilirubin Urine: NEGATIVE
GLUCOSE, UA: NEGATIVE mg/dL
Hgb urine dipstick: NEGATIVE
KETONES UR: NEGATIVE mg/dL
Nitrite: NEGATIVE
PROTEIN: NEGATIVE mg/dL
Specific Gravity, Urine: 1.017 (ref 1.005–1.030)
pH: 5 (ref 5.0–8.0)

## 2017-01-14 LAB — ABO/RH: ABO/RH(D): O NEG

## 2017-01-14 LAB — POCT PREGNANCY, URINE: Preg Test, Ur: POSITIVE — AB

## 2017-01-14 LAB — HCG, QUANTITATIVE, PREGNANCY: hCG, Beta Chain, Quant, S: 9631 m[IU]/mL — ABNORMAL HIGH (ref <5)

## 2017-01-14 MED ORDER — CEPHALEXIN 500 MG PO CAPS: 500.0000 mg | ORAL_CAPSULE | Freq: Four times a day (QID) | ORAL | 0 refills | Status: DC

## 2017-01-14 NOTE — MAU Note (Signed)
Urine in lab 

## 2017-01-14 NOTE — Discharge Instructions (Signed)

## 2017-01-14 NOTE — MAU Provider Note (Signed)
History     CSN: [id number]  Arrival date and time: 01/14/17 1415   First Provider Initiated Contact with Patient 01/14/17 1737      Chief Complaint  Patient presents with  . Abdominal Pain  . Back Pain  . Fall   HPI   [name] is a 21 y.o. female G1P0 @ [redacted]w[redacted]d here in MAU with complaints of back pain and lower abdominal pain following a fall that occurred last night. States she was walking down the steps and slipped and fell; landing on her lower back and butt. She denies bleeding. States shortly after the fall she had lower abdominal pain that was sharp. States the pain comes and goes in her abdomen. States she has tried taking tylenol which helped some. No urinary complaints   OB History    Gravida Para Term Preterm AB Living   1             SAB TAB Ectopic Multiple Live Births                  Past Medical History:  Diagnosis Date  . Anxiety   . Depression   . Headache   . Mental disorder   . Obesity     Past Surgical History:  Procedure Laterality Date  . CORNEAL TRANSPLANT  2016   per pt report  . WISDOM TOOTH EXTRACTION  Oct 2012   all four    Family History  Problem Relation Age of Onset  . Bipolar disorder Maternal Grandmother     Social History   Tobacco Use  . Smoking status: Never Smoker  . Smokeless tobacco: Never Used  Substance Use Topics  . Alcohol use: No  . Drug use: No    Allergies:  Allergies  Allergen Reactions  . Clindamycin/Lincomycin Hives    Medications Prior to Admission  Medication Sig Dispense Refill Last Dose  . acetaminophen (TYLENOL) 325 MG tablet Take 650 mg by mouth every 6 (six) hours as needed for mild pain.   04/03/2015 at Unknown time  . cephALEXin (KEFLEX) 500 MG capsule Take 1 capsule (500 mg total) by mouth 4 (four) times daily. (Patient not taking: Reported on 04/04/2015) 28 capsule 0 Completed Course at Unknown time  . cephALEXin (KEFLEX) 500 MG capsule Take 1 capsule (500 mg total) by mouth 3  (three) times daily. (Patient not taking: Reported on 04/04/2015) 30 capsule 2 Completed Course at Unknown time  . ondansetron (ZOFRAN ODT) 4 MG disintegrating tablet Take 1 tablet (4 mg total) by mouth every 8 (eight) hours as needed for nausea. 10 tablet 0   . ondansetron (ZOFRAN) 4 MG tablet Take 1 tablet (4 mg total) by mouth every 6 (six) hours. (Patient not taking: Reported on 04/04/2015) 12 tablet 0 Not Taking at Unknown time   Results for orders placed or performed during the hospital encounter of 01/14/17 (from the past 48 hour(s))  Urinalysis, Routine w reflex microscopic     Status: Abnormal   Collection Time: 01/14/17  2:20 PM  Result Value Ref Range   Color, Urine YELLOW YELLOW   APPearance HAZY (A) CLEAR   Specific Gravity, Urine 1.017 1.005 - 1.030   pH 5.0 5.0 - 8.0   Glucose, UA NEGATIVE NEGATIVE mg/dL   Hgb urine dipstick NEGATIVE NEGATIVE   Bilirubin Urine NEGATIVE NEGATIVE   Ketones, ur NEGATIVE NEGATIVE mg/dL   Protein, ur NEGATIVE NEGATIVE mg/dL   Nitrite NEGATIVE NEGATIVE   Leukocytes, UA LARGE (A)  NEGATIVE   RBC / HPF 0-5 0 - 5 RBC/hpf   WBC, UA 6-30 0 - 5 WBC/hpf   Bacteria, UA MANY (A) NONE SEEN   Squamous Epithelial / LPF 0-5 (A) NONE SEEN   Mucus PRESENT   Pregnancy, urine POC     Status: Abnormal   Collection Time: 01/14/17  2:33 PM  Result Value Ref Range   Preg Test, Ur POSITIVE (A) NEGATIVE    Comment:        THE SENSITIVITY OF THIS METHODOLOGY IS >24 mIU/mL   CBC     Status: None   Collection Time: 01/14/17  5:34 PM  Result Value Ref Range   WBC 10.3 4.0 - 10.5 K/uL   RBC 4.31 3.87 - 5.11 MIL/uL   Hemoglobin 12.9 12.0 - 15.0 g/dL   HCT 16.138.3 09.636.0 - 04.546.0 %   MCV 88.9 78.0 - 100.0 fL   MCH 29.9 26.0 - 34.0 pg   MCHC 33.7 30.0 - 36.0 g/dL   RDW 40.914.0 81.111.5 - 91.415.5 %   Platelets 372 150 - 400 K/uL  ABO/Rh     Status: None   Collection Time: 01/14/17  5:34 PM  Result Value Ref Range   ABO/RH(D) O NEG   HIV antibody     Status: None   Collection  Time: 01/14/17  5:34 PM  Result Value Ref Range   HIV Screen 4th Generation wRfx Non Reactive Non Reactive    Comment: (NOTE) Performed At: [facility] [address] [place], [place] [id number] Jolene SchimkeNagendra Sanjai MD YQ:[phone]   hCG, quantitative, pregnancy     Status: Abnormal   Collection Time: 01/14/17  5:34 PM  Result Value Ref Range   hCG, Beta Chain, Quant, S 9,631 (H) <5 mIU/mL    Comment:          GEST. AGE      CONC.  (mIU/mL)   <=1 WEEK        5 - 50     2 WEEKS       50 - 500     3 WEEKS       100 - 10,000     4 WEEKS     1,000 - 30,000     5 WEEKS     3,500 - 115,000   6-8 WEEKS     12,000 - 270,000    12 WEEKS     15,000 - 220,000        FEMALE AND NON-PREGNANT FEMALE:     LESS THAN 5 mIU/mL    Review of Systems  Constitutional: Negative for fever.  Gastrointestinal: Negative for abdominal pain.  Genitourinary: Negative for dysuria, hematuria, vaginal bleeding, vaginal discharge and vaginal pain.   Physical Exam   Blood pressure 139/66, pulse 98, temperature 98.1 F (36.7 C), temperature source Oral, resp. rate 16, last menstrual period 12/08/2016, SpO2 100 %.  Physical Exam  Constitutional: She is oriented to person, place, and time. She appears well-developed and well-nourished. No distress.  HENT:  Head: Normocephalic.  Eyes: Pupils are equal, round, and reactive to light.  GI: Soft. Normal appearance. There is generalized tenderness. There is no rigidity, no rebound, no guarding and no CVA tenderness.  Musculoskeletal: Normal range of motion.  Neurological: She is alert and oriented to person, place, and time.  Skin: Skin is warm. She is not diaphoretic.  Psychiatric: Her behavior is normal.    MAU Course  Procedures  None  MDM  Wet prep &  GC HIV, CBC, Hcg, ABO US OB transvaginal  O negative blood type: patient without bleeding today.  Assessment and Plan   A:  1. Urinary tract infection in mother during first trimester of  pregnancy   2. Abdominal pain in pregnancy, antepartum   3. Back pain in pregnancy     P:  Discharge home in stable condition Rx: Keflex Increase PO fluid intake Start prenatal care Prenatal vitamins daily Return to MAU as needed if symptoms worsen   Rasch, Harolyn RutherfordJennifer I, NP 01/15/2017 10:14 AM

## 2017-01-14 NOTE — MAU Note (Signed)
Pt states that she fell down the stairs last night at home and has been having back and abdominal pain ever since.  She took a pregnancy test last week and all 4 came back positive.  Denies VB.  Last intercourse was three days ago.

## 2017-01-15 LAB — HIV ANTIBODY (ROUTINE TESTING W REFLEX): HIV Screen 4th Generation wRfx: NONREACTIVE

## 2017-01-17 LAB — CULTURE, OB URINE: Culture: >=100000 — AB

## 2017-01-30 ENCOUNTER — Inpatient Hospital Stay (HOSPITAL_COMMUNITY)
Admission: AD | Admit: 2017-01-30 | Discharge: 2017-01-30 | Disposition: A | Discharge status: Home / Self Care | Payer: Medicaid Other | Source: Ambulatory Visit | Attending: Obstetrics & Gynecology | Admitting: Obstetrics & Gynecology

## 2017-01-30 ENCOUNTER — Encounter (HOSPITAL_COMMUNITY): Payer: Self-pay

## 2017-01-30 DIAGNOSIS — Z3A01 Less than 8 weeks gestation of pregnancy: Secondary | ICD-10-CM | POA: Diagnosis not present

## 2017-01-30 DIAGNOSIS — O99211 Obesity complicating pregnancy, first trimester: Secondary | ICD-10-CM | POA: Insufficient documentation

## 2017-01-30 DIAGNOSIS — O99611 Diseases of the digestive system complicating pregnancy, first trimester: Secondary | ICD-10-CM | POA: Diagnosis not present

## 2017-01-30 DIAGNOSIS — Z79899 Other long term (current) drug therapy: Secondary | ICD-10-CM | POA: Diagnosis not present

## 2017-01-30 DIAGNOSIS — F419 Anxiety disorder, unspecified: Secondary | ICD-10-CM | POA: Insufficient documentation

## 2017-01-30 DIAGNOSIS — O2341 Unspecified infection of urinary tract in pregnancy, first trimester: Secondary | ICD-10-CM | POA: Insufficient documentation

## 2017-01-30 DIAGNOSIS — O9989 Other specified diseases and conditions complicating pregnancy, childbirth and the puerperium: Secondary | ICD-10-CM | POA: Diagnosis not present

## 2017-01-30 DIAGNOSIS — R8290 Unspecified abnormal findings in urine: Secondary | ICD-10-CM | POA: Diagnosis not present

## 2017-01-30 DIAGNOSIS — K5900 Constipation, unspecified: Secondary | ICD-10-CM | POA: Insufficient documentation

## 2017-01-30 DIAGNOSIS — M549 Dorsalgia, unspecified: Secondary | ICD-10-CM

## 2017-01-30 DIAGNOSIS — O99341 Other mental disorders complicating pregnancy, first trimester: Secondary | ICD-10-CM | POA: Insufficient documentation

## 2017-01-30 DIAGNOSIS — F329 Major depressive disorder, single episode, unspecified: Secondary | ICD-10-CM | POA: Diagnosis not present

## 2017-01-30 DIAGNOSIS — O99891 Other specified diseases and conditions complicating pregnancy: Secondary | ICD-10-CM

## 2017-01-30 LAB — URINALYSIS, ROUTINE W REFLEX MICROSCOPIC
BILIRUBIN URINE: NEGATIVE
Glucose, UA: NEGATIVE mg/dL
HGB URINE DIPSTICK: NEGATIVE
KETONES UR: 5 mg/dL — AB
NITRITE: NEGATIVE
PH: 6 (ref 5.0–8.0)
Protein, ur: NEGATIVE mg/dL
SPECIFIC GRAVITY, URINE: 1.015 (ref 1.005–1.030)

## 2017-01-30 LAB — WET PREP, GENITAL
Clue Cells Wet Prep HPF POC: NONE SEEN
SPERM: NONE SEEN
TRICH WET PREP: NONE SEEN
YEAST WET PREP: NONE SEEN

## 2017-01-30 MED ORDER — PHENAZOPYRIDINE HCL 200 MG PO TABS
200.0000 mg | ORAL_TABLET | Freq: Three times a day (TID) | ORAL | 0 refills | Status: DC
Start: 2017-01-30 — End: 2017-06-20

## 2017-01-30 MED ORDER — CEPHALEXIN 500 MG PO CAPS: 500.0000 mg | ORAL_CAPSULE | Freq: Four times a day (QID) | ORAL | 0 refills | Status: DC

## 2017-01-30 NOTE — MAU Provider Note (Signed)
History     CSN: [id number]  Arrival date and time: 01/30/17 1409   First Provider Initiated Contact with Patient 01/30/17 1845      Chief Complaint  Patient presents with  . Urinary Tract Infection   HPI  Ms.  [name] is a 21 y.o. year old G1P0 female at 6444w4d weeks gestation who presents to MAU reporting dark urine x 1 wk, lower abdominal pain, spotting and constipation since having stomach bug last week.    Past Medical History:  Diagnosis Date  . Anxiety   . Depression   . Headache   . Mental disorder   . Obesity     Past Surgical History:  Procedure Laterality Date  . CORNEAL TRANSPLANT  2016   per pt report  . WISDOM TOOTH EXTRACTION  Oct 2012   all four    Family History  Problem Relation Age of Onset  . Bipolar disorder Maternal Grandmother     Social History   Tobacco Use  . Smoking status: Never Smoker  . Smokeless tobacco: Never Used  Substance Use Topics  . Alcohol use: No  . Drug use: No    Allergies:  Allergies  Allergen Reactions  . Clindamycin/Lincomycin Hives    Medications Prior to Admission  Medication Sig Dispense Refill Last Dose  . acetaminophen (TYLENOL) 325 MG tablet Take 650 mg by mouth every 6 (six) hours as needed for mild pain.   04/03/2015 at Unknown time  . cephALEXin (KEFLEX) 500 MG capsule Take 1 capsule (500 mg total) by mouth 4 (four) times daily. 28 capsule 0     Review of Systems  Constitutional: Negative.   HENT: Negative.   Eyes: Negative.   Respiratory: Negative.   Cardiovascular: Negative.   Gastrointestinal: Positive for constipation.  Endocrine: Negative.   Genitourinary: Positive for dysuria (urinary odor), frequency, hematuria and urgency.  Musculoskeletal: Negative.   Skin: Negative.   Allergic/Immunologic: Negative.   Neurological: Negative.   Hematological: Negative.   Psychiatric/Behavioral: Negative.    Physical Exam   Blood pressure 123/67, pulse 96, temperature 98.8 F (37.1  C), resp. rate 18, height 5\' 5"  (1.651 m), weight 205 lb (93 kg), last menstrual period 12/08/2016.  Physical Exam  Nursing note and vitals reviewed. Constitutional: She is oriented to person, place, and time. She appears well-developed and well-nourished.  HENT:  Head: Normocephalic.  Eyes: Pupils are equal, round, and reactive to light.  Cardiovascular: Normal rate, regular rhythm and normal heart sounds.  Respiratory: Effort normal and breath sounds normal.  GI: Soft. Bowel sounds are normal.  Genitourinary:  Genitourinary Comments: Uterus: non-tender, cx: smooth, pink, no lesions, small amt of thick, white vaginal d/c -- Wet Prep, GC/CT pending, closed/long/firm, no CMT or friability, no adnexal tenderness // NO evidence of VB   Musculoskeletal: Normal range of motion.  Neurological: She is alert and oriented to person, place, and time.  Skin: Skin is warm and dry.  Psychiatric: She has a normal mood and affect. Her behavior is normal. Judgment and thought content normal.    MAU Course  Procedures  MDM CCUA UCx Wet Prep GC/CT -- pending  Results for orders placed or performed during the hospital encounter of 01/30/17 (from the past 24 hour(s))  Urinalysis, Routine w reflex microscopic     Status: Abnormal   Collection Time: 01/30/17  2:30 PM  Result Value Ref Range   Color, Urine YELLOW YELLOW   APPearance HAZY (A) CLEAR   Specific Gravity, Urine  1.015 1.005 - 1.030   pH 6.0 5.0 - 8.0   Glucose, UA NEGATIVE NEGATIVE mg/dL   Hgb urine dipstick NEGATIVE NEGATIVE   Bilirubin Urine NEGATIVE NEGATIVE   Ketones, ur 5 (A) NEGATIVE mg/dL   Protein, ur NEGATIVE NEGATIVE mg/dL   Nitrite NEGATIVE NEGATIVE   Leukocytes, UA MODERATE (A) NEGATIVE   RBC / HPF 0-5 0 - 5 RBC/hpf   WBC, UA 0-5 0 - 5 WBC/hpf   Bacteria, UA RARE (A) NONE SEEN   Squamous Epithelial / LPF 0-5 (A) NONE SEEN   Mucus PRESENT   Wet prep, genital     Status: Abnormal   Collection Time: 01/30/17  6:42 PM   Result Value Ref Range   Yeast Wet Prep HPF POC NONE SEEN NONE SEEN   Trich, Wet Prep NONE SEEN NONE SEEN   Clue Cells Wet Prep HPF POC NONE SEEN NONE SEEN   WBC, Wet Prep HPF POC MANY (A) NONE SEEN   Sperm NONE SEEN     Assessment and Plan  Abnormal urine odor - UCx pending - Rx for Keflex 500 mg QID x 7 days - Rx for Pyridium 200 mg TID x 2 days  Back pain affecting pregnancy in first trimester - Can take Tylenol 1000 mg every 6 hrs prn pain  [facility] -- list given Patient verbalized an understanding of the plan of care and agrees.    Raelyn Moraolitta Daymion Nazaire, MSN, CNM 01/30/2017, 7:59 PM

## 2017-01-30 NOTE — MAU Note (Signed)
Pt c/o constipation and stomach pain since she's had a stomach bug last week.

## 2017-01-30 NOTE — MAU Note (Signed)
Pt reports dark urine and cramping with an odor x 1 week. C/O lower abd and back pain. And spotting.

## 2017-01-30 NOTE — Discharge Instructions (Signed)
[facility]  ° ° °[facility]       Phone: [phone] ° °[facility] Phone: [phone] ° °[facility] [place]  Phone: [phone] ° °[facility] [place]  Phone: [phone] ° °[facility]  Phone: [phone] ° °[facility]       Phone: [phone] ° °[facility]    Phone: [phone]  ° °[facility]    Phone: [phone] ° °[facility]    Phone: [phone] ° °[facility]    Phone: [phone] ° °[facility]    Phone: [phone] ° °[facility]       Phone: [phone]  ° °[facility]  Phone: [phone] ° °[facility]    Phone: [phone] ° °[facility]   Phone: [phone] ° °[facility]      Phone: [phone] ° °[facility]    Phone: [phone] ° °

## 2017-02-01 LAB — CULTURE, OB URINE: Special Requests: NORMAL

## 2017-02-02 LAB — GC/CHLAMYDIA PROBE AMP (~~LOC~~) NOT AT ARMC
CHLAMYDIA, DNA PROBE: NEGATIVE
Neisseria Gonorrhea: NEGATIVE

## 2017-02-03 NOTE — L&D Delivery Note (Signed)
Delivery Note At 8:38 AM a viable and healthy female was delivered via Vaginal, Spontaneous (Presentation: Left occiput; anterior ).  APGAR: 9, 9; weight 6 lb 9.3 oz (2985 g).   Placenta status: spontaneous, intact.  Cord: 3VC   Anesthesia: Epidural   Episiotomy: None Lacerations: Labial Suture Repair: NA Est. Blood Loss (mL): 250  Mom to postpartum.  Baby to Couplet care / Skin to Skin.  Selena ReedsKendra Treyveon Carr 09/18/2017, 9:31 AM

## 2017-03-16 ENCOUNTER — Encounter (HOSPITAL_COMMUNITY): Payer: Self-pay

## 2017-03-16 ENCOUNTER — Emergency Department (HOSPITAL_COMMUNITY)
Admission: EM | Admit: 2017-03-16 | Discharge: 2017-03-16 | Disposition: A | Discharge status: Home / Self Care | Payer: Medicaid Other | Attending: Emergency Medicine | Admitting: Emergency Medicine

## 2017-03-16 DIAGNOSIS — M545 Low back pain, unspecified: Secondary | ICD-10-CM

## 2017-03-16 DIAGNOSIS — R51 Headache: Secondary | ICD-10-CM | POA: Diagnosis not present

## 2017-03-16 DIAGNOSIS — F909 Attention-deficit hyperactivity disorder, unspecified type: Secondary | ICD-10-CM | POA: Diagnosis not present

## 2017-03-16 DIAGNOSIS — Z79899 Other long term (current) drug therapy: Secondary | ICD-10-CM | POA: Diagnosis not present

## 2017-03-16 DIAGNOSIS — R519 Headache, unspecified: Secondary | ICD-10-CM

## 2017-03-16 MED ORDER — ACETAMINOPHEN 325 MG PO TABS
650.0000 mg | ORAL_TABLET | Freq: Once | ORAL | Status: AC
  Administered 2017-03-16: 650 mg via ORAL
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
## 2017-03-16 NOTE — Discharge Instructions (Signed)
Take Tylenol for pain control. Use ice packs or heating pads if this helps control your pain. You likely have continued muscle stiffness and soreness over the next couple days.   Follow up with your OB/GYN this week for reevaluation. Return to the emergency room immediately if you develop vision changes, vomiting, slurred speech, numbness, abdominal pain, vaginal bleeding, loss of bowel or bladder control, or any new or worsening symptoms.

## 2017-06-20 ENCOUNTER — Emergency Department (HOSPITAL_COMMUNITY)
Admission: EM | Admit: 2017-06-20 | Discharge: 2017-06-20 | Disposition: A | Discharge status: Home / Self Care | Payer: Medicaid Other | Attending: Emergency Medicine | Admitting: Emergency Medicine

## 2017-06-20 ENCOUNTER — Encounter (HOSPITAL_COMMUNITY): Payer: Self-pay | Admitting: Nurse Practitioner

## 2017-06-20 DIAGNOSIS — O99342 Other mental disorders complicating pregnancy, second trimester: Secondary | ICD-10-CM | POA: Diagnosis present

## 2017-06-20 DIAGNOSIS — R45851 Suicidal ideations: Secondary | ICD-10-CM | POA: Insufficient documentation

## 2017-06-20 DIAGNOSIS — F329 Major depressive disorder, single episode, unspecified: Secondary | ICD-10-CM | POA: Insufficient documentation

## 2017-06-20 DIAGNOSIS — F4325 Adjustment disorder with mixed disturbance of emotions and conduct: Secondary | ICD-10-CM | POA: Diagnosis present

## 2017-06-20 LAB — SALICYLATE LEVEL: Salicylate Lvl: <7 mg/dL (ref 2.8–30.0)

## 2017-06-20 LAB — COMPREHENSIVE METABOLIC PANEL
ALBUMIN: 3.4 g/dL — AB (ref 3.5–5.0)
ALK PHOS: 69 U/L (ref 38–126)
ALT: 18 U/L (ref 14–54)
AST: 19 U/L (ref 15–41)
Anion gap: 13 (ref 5–15)
BUN: 6 mg/dL (ref 6–20)
CALCIUM: 9.2 mg/dL (ref 8.9–10.3)
CO2: 18 mmol/L — AB (ref 22–32)
CREATININE: 0.41 mg/dL — AB (ref 0.44–1.00)
Chloride: 106 mmol/L (ref 101–111)
GFR calc Af Amer: >60 mL/min (ref >60)
GFR calc non Af Amer: >60 mL/min (ref >60)
GLUCOSE: 107 mg/dL — AB (ref 65–99)
Potassium: 3.8 mmol/L (ref 3.5–5.1)
SODIUM: 137 mmol/L (ref 135–145)
Total Bilirubin: 0.2 mg/dL — ABNORMAL LOW (ref 0.3–1.2)
Total Protein: 6.9 g/dL (ref 6.5–8.1)

## 2017-06-20 LAB — CBC
HEMATOCRIT: 35.1 % — AB (ref 36.0–46.0)
HEMOGLOBIN: 11.8 g/dL — AB (ref 12.0–15.0)
MCH: 29.1 pg (ref 26.0–34.0)
MCHC: 33.6 g/dL (ref 30.0–36.0)
MCV: 86.5 fL (ref 78.0–100.0)
Platelets: 337 10*3/uL (ref 150–400)
RBC: 4.06 MIL/uL (ref 3.87–5.11)
RDW: 13.4 % (ref 11.5–15.5)
WBC: 14 10*3/uL — ABNORMAL HIGH (ref 4.0–10.5)

## 2017-06-20 LAB — ETHANOL: Alcohol, Ethyl (B): <10 mg/dL (ref <10)

## 2017-06-20 LAB — RAPID URINE DRUG SCREEN, HOSP PERFORMED
AMPHETAMINES: NOT DETECTED
BARBITURATES: NOT DETECTED
Benzodiazepines: NOT DETECTED
Cocaine: NOT DETECTED
Opiates: NOT DETECTED
TETRAHYDROCANNABINOL: NOT DETECTED

## 2017-06-20 LAB — ACETAMINOPHEN LEVEL: Acetaminophen (Tylenol), Serum: <10 ug/mL — ABNORMAL LOW (ref 10–30)

## 2017-06-20 MED ORDER — ACETAMINOPHEN 325 MG PO TABS: 650.0000 mg | ORAL_TABLET | ORAL | Status: DC | PRN

## 2017-06-20 MED ORDER — PRENATAL MULTIVITAMIN CH
1.0000 | ORAL_TABLET | Freq: Every day | ORAL | Status: DC
  Filled 2017-06-20: qty 1

## 2017-06-20 MED ORDER — COMPLETENATE 29-1 MG PO CHEW
1.0000 | CHEWABLE_TABLET | Freq: Every morning | ORAL | Status: DC
  Filled 2017-06-20: qty 1
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
## 2017-06-20 NOTE — ED Notes (Signed)
Bed: [facility] Expected date:  Expected time:  Means of arrival:  Comments: 

## 2017-06-20 NOTE — ED Notes (Signed)
TTS consult in process at bedside at this time. 

## 2017-06-20 NOTE — BHH Counselor (Signed)
Per Dr. Elsie Saas, this pt does not require psychiatric hospitalization at this time.  Pt is to be discharged from [facility] with recommendation to follow up with [facility].  This has been included in pt's discharge instructions.
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
## 2017-09-14 ENCOUNTER — Other Ambulatory Visit: Payer: Self-pay | Admitting: Obstetrics and Gynecology

## 2017-09-15 NOTE — Addendum Note (Signed)
Addended by: Malva LimesANDERSON, MARK E on: 09/15/2017 03:28 PM   Modules accepted: Orders

## 2017-09-17 ENCOUNTER — Inpatient Hospital Stay (HOSPITAL_COMMUNITY): Payer: Medicaid Other | Admitting: Anesthesiology

## 2017-09-17 ENCOUNTER — Inpatient Hospital Stay (HOSPITAL_COMMUNITY)
Admission: AD | Admit: 2017-09-17 | Discharge: 2017-09-20 | DRG: 807 | Disposition: A | Discharge status: Home / Self Care | Payer: Medicaid Other | Attending: Obstetrics and Gynecology | Admitting: Obstetrics and Gynecology

## 2017-09-17 ENCOUNTER — Other Ambulatory Visit: Payer: Self-pay

## 2017-09-17 ENCOUNTER — Encounter (HOSPITAL_COMMUNITY): Payer: Self-pay

## 2017-09-17 DIAGNOSIS — O480 Post-term pregnancy: Secondary | ICD-10-CM | POA: Diagnosis present

## 2017-09-17 DIAGNOSIS — Z3A40 40 weeks gestation of pregnancy: Secondary | ICD-10-CM | POA: Diagnosis not present

## 2017-09-17 DIAGNOSIS — R8290 Unspecified abnormal findings in urine: Secondary | ICD-10-CM

## 2017-09-17 HISTORY — DX: Keratoconus, unspecified, left eye: H18.602

## 2017-09-17 LAB — CBC
HCT: 39.9 % (ref 36.0–46.0)
HEMOGLOBIN: 13.7 g/dL (ref 12.0–15.0)
MCH: 28.8 pg (ref 26.0–34.0)
MCHC: 34.3 g/dL (ref 30.0–36.0)
MCV: 84 fL (ref 78.0–100.0)
Platelets: 295 10*3/uL (ref 150–400)
RBC: 4.75 MIL/uL (ref 3.87–5.11)
RDW: 15 % (ref 11.5–15.5)
WBC: 14.6 10*3/uL — ABNORMAL HIGH (ref 4.0–10.5)

## 2017-09-17 LAB — TYPE AND SCREEN
ABO/RH(D): O NEG
ANTIBODY SCREEN: NEGATIVE

## 2017-09-17 LAB — POCT FERN TEST: POCT Fern Test: POSITIVE

## 2017-09-17 MED ORDER — ACETAMINOPHEN 325 MG PO TABS
650.0000 mg | ORAL_TABLET | ORAL | Status: DC | PRN
  Administered 2017-09-18: 650 mg via ORAL
  Filled 2017-09-17: qty 2

## 2017-09-17 MED ORDER — PHENYLEPHRINE 40 MCG/ML (10ML) SYRINGE FOR IV PUSH (FOR BLOOD PRESSURE SUPPORT): 80.0000 ug | PREFILLED_SYRINGE | INTRAVENOUS | Status: DC | PRN

## 2017-09-17 MED ORDER — OXYTOCIN BOLUS FROM INFUSION
500.0000 mL | Freq: Once | INTRAVENOUS | Status: AC
  Administered 2017-09-18: 500 mL via INTRAVENOUS

## 2017-09-17 MED ORDER — EPHEDRINE 5 MG/ML INJ: 10.0000 mg | INTRAVENOUS | Status: DC | PRN

## 2017-09-17 MED ORDER — ONDANSETRON HCL 4 MG/2ML IJ SOLN: 4.0000 mg | Freq: Four times a day (QID) | INTRAMUSCULAR | Status: DC | PRN

## 2017-09-17 MED ORDER — OXYTOCIN 40 UNITS IN LACTATED RINGERS INFUSION - SIMPLE MED
1.0000 m[IU]/min | INTRAVENOUS | Status: DC
Start: 2017-09-17 — End: 2017-09-18
  Administered 2017-09-17: 8 m[IU]/min via INTRAVENOUS
  Administered 2017-09-17: 2 m[IU]/min via INTRAVENOUS
  Administered 2017-09-17: 6 m[IU]/min via INTRAVENOUS
  Administered 2017-09-17: 4 m[IU]/min via INTRAVENOUS

## 2017-09-17 MED ORDER — LACTATED RINGERS IV SOLN: 500.0000 mL | INTRAVENOUS | Status: DC | PRN

## 2017-09-17 MED ORDER — DIPHENHYDRAMINE HCL 50 MG/ML IJ SOLN: 12.5000 mg | INTRAMUSCULAR | Status: DC | PRN

## 2017-09-17 MED ORDER — LIDOCAINE HCL (PF) 1 % IJ SOLN
INTRAMUSCULAR | Status: DC | PRN
  Administered 2017-09-17: 5 mL via EPIDURAL

## 2017-09-17 MED ORDER — ACETAMINOPHEN 325 MG PO TABS: 650.0000 mg | ORAL_TABLET | ORAL | Status: DC | PRN

## 2017-09-17 MED ORDER — SOD CITRATE-CITRIC ACID 500-334 MG/5ML PO SOLN: 30.0000 mL | ORAL | Status: DC | PRN

## 2017-09-17 MED ORDER — LACTATED RINGERS IV SOLN: 500.0000 mL | Freq: Once | INTRAVENOUS | Status: DC

## 2017-09-17 MED ORDER — LIDOCAINE HCL (PF) 1 % IJ SOLN: 30.0000 mL | INTRAMUSCULAR | Status: DC | PRN

## 2017-09-17 MED ORDER — OXYTOCIN BOLUS FROM INFUSION: 500.0000 mL | Freq: Once | INTRAVENOUS | Status: DC

## 2017-09-17 MED ORDER — FENTANYL CITRATE (PF) 100 MCG/2ML IJ SOLN
100.0000 ug | Freq: Once | INTRAMUSCULAR | Status: AC
  Administered 2017-09-17: 100 ug via INTRAVENOUS

## 2017-09-17 MED ORDER — OXYTOCIN 40 UNITS IN LACTATED RINGERS INFUSION - SIMPLE MED: 2.5000 [IU]/h | INTRAVENOUS | Status: DC

## 2017-09-17 MED ORDER — FENTANYL CITRATE (PF) 100 MCG/2ML IJ SOLN
INTRAMUSCULAR | Status: DC | PRN
  Administered 2017-09-17: 100 ug via EPIDURAL

## 2017-09-17 MED ORDER — LIDOCAINE HCL (PF) 1 % IJ SOLN
30.0000 mL | INTRAMUSCULAR | Status: DC | PRN
  Filled 2017-09-17: qty 30

## 2017-09-17 MED ORDER — LACTATED RINGERS IV SOLN
INTRAVENOUS | Status: DC
  Administered 2017-09-17 (×2): via INTRAVENOUS

## 2017-09-17 MED ORDER — OXYTOCIN 40 UNITS IN LACTATED RINGERS INFUSION - SIMPLE MED
2.5000 [IU]/h | INTRAVENOUS | Status: DC
  Filled 2017-09-17: qty 1000

## 2017-09-17 MED ORDER — BUPIVACAINE HCL (PF) 0.25 % IJ SOLN
INTRAMUSCULAR | Status: DC | PRN
  Administered 2017-09-17: 8 mL via EPIDURAL

## 2017-09-17 MED ORDER — FENTANYL CITRATE (PF) 100 MCG/2ML IJ SOLN
INTRAMUSCULAR | Status: AC
  Administered 2017-09-17: 100 ug via INTRAVENOUS
  Filled 2017-09-17: qty 2

## 2017-09-17 MED ORDER — FLEET ENEMA 7-19 GM/118ML RE ENEM: 1.0000 | ENEMA | RECTAL | Status: DC | PRN

## 2017-09-17 MED ORDER — MISOPROSTOL 25 MCG QUARTER TABLET: 25.0000 ug | ORAL_TABLET | ORAL | Status: DC | PRN

## 2017-09-17 MED ORDER — OXYCODONE-ACETAMINOPHEN 5-325 MG PO TABS: 2.0000 | ORAL_TABLET | ORAL | Status: DC | PRN

## 2017-09-17 MED ORDER — LACTATED RINGERS IV SOLN: INTRAVENOUS | Status: DC

## 2017-09-17 MED ORDER — TERBUTALINE SULFATE 1 MG/ML IJ SOLN: 0.2500 mg | Freq: Once | INTRAMUSCULAR | Status: DC | PRN

## 2017-09-17 MED ORDER — OXYCODONE-ACETAMINOPHEN 5-325 MG PO TABS: 1.0000 | ORAL_TABLET | ORAL | Status: DC | PRN

## 2017-09-17 MED ORDER — FENTANYL 2.5 MCG/ML BUPIVACAINE 1/10 % EPIDURAL INFUSION (WH - ANES)
14.0000 mL/h | INTRAMUSCULAR | Status: DC | PRN
  Administered 2017-09-17 – 2017-09-18 (×3): 14 mL/h via EPIDURAL
  Filled 2017-09-17 (×3): qty 100

## 2017-09-17 MED ORDER — PHENYLEPHRINE 40 MCG/ML (10ML) SYRINGE FOR IV PUSH (FOR BLOOD PRESSURE SUPPORT)
80.0000 ug | PREFILLED_SYRINGE | INTRAVENOUS | Status: DC | PRN
  Filled 2017-09-17: qty 10

## 2017-09-17 NOTE — MAU Note (Signed)
Urine in lab 

## 2017-09-17 NOTE — Anesthesia Preprocedure Evaluation (Signed)
Anesthesia Evaluation  Patient identified by MRN, date of birth, ID band Patient awake    Reviewed: Allergy & Precautions, NPO status , Patient's Chart, lab work & pertinent test results  Airway Mallampati: II  TM Distance: >3 FB Neck ROM: Full    Dental no notable dental hx. (+) Teeth Intact   Pulmonary neg pulmonary ROS,    Pulmonary exam normal breath sounds clear to auscultation       Cardiovascular negative cardio ROS Normal cardiovascular exam Rhythm:Regular Rate:Normal     Neuro/Psych  Headaches, PSYCHIATRIC DISORDERS    GI/Hepatic negative GI ROS, Neg liver ROS,   Endo/Other    Renal/GU      Musculoskeletal   Abdominal (+) + obese,   Peds  Hematology  (+) anemia ,   Anesthesia Other Findings   Reproductive/Obstetrics (+) Pregnancy                             Anesthesia Physical Anesthesia Plan  ASA: III  Anesthesia Plan: Epidural   Post-op Pain Management:    Induction:   PONV Risk Score and Plan:   Airway Management Planned:   Additional Equipment:   Intra-op Plan:   Post-operative Plan:   Informed Consent: I have reviewed the patients History and Physical, chart, labs and discussed the procedure including the risks, benefits and alternatives for the proposed anesthesia with the patient or authorized representative who has indicated his/her understanding and acceptance.     Plan Discussed with:   Anesthesia Plan Comments:         Anesthesia Quick Evaluation

## 2017-09-17 NOTE — Anesthesia Pain Management Evaluation Note (Signed)
  CRNA Pain Management Visit Note  Patient: Selena CatalanSamantha C Piercefield, 22 y.o., female  "Hello [facility]. We have an anesthesia team available at all times to provide care throughout the hospital, including epidural management and anesthesia for C-section. I don't know your plan for the delivery whether it a natural birth, water birth, IV sedation, nitrous supplementation, doula or epidural, but we want to meet your pain goals."   1.Was your pain managed to your expectations on prior hospitalizations?   Yes   2.What is your expectation for pain management during this hospitalization?     Epidural and IV pain meds  3.How can we help you reach that goal? Be available  Record the patient's initial score and the patient's pain goal.   Pain: 6  Pain Goal: 6 The [facility] wants you to be able to say your pain was always managed very well.  [facility] EdMERRITT,Jazarah Capili 09/17/2017

## 2017-09-17 NOTE — MAU Note (Signed)
Pt presents with c/o ctxs every 5-6 minutes apart.  Reports ctxs began @ 0830.  Denies VB or LOF.  Reports +FM. For IOL @ midnight

## 2017-09-17 NOTE — Anesthesia Procedure Notes (Signed)
Epidural Patient location during procedure: OB Start time: 09/17/2017 7:41 PM End time: 09/17/2017 8:00 PM  Staffing Anesthesiologist: Trevor IhaHouser, Mahrukh Seguin A, MD Performed: anesthesiologist   Preanesthetic Checklist Completed: patient identified, site marked, surgical consent, pre-op evaluation, timeout performed, IV checked, risks and benefits discussed and monitors and equipment checked  Epidural Patient position: sitting Prep: site prepped and draped and DuraPrep Patient monitoring: continuous pulse ox and blood pressure Approach: midline Location: L2-L3 Injection technique: LOR air  Needle:  Needle type: Tuohy  Needle gauge: 17 G Needle length: 9 cm and 9 Needle insertion depth: 9 cm Catheter type: closed end flexible Catheter size: 19 Gauge Catheter at skin depth: 14 cm Test dose: negative  Assessment Events: blood not aspirated, injection not painful, no injection resistance, negative IV test and no paresthesia  Additional Notes 2 attempts. Pt tolerated procedure well

## 2017-09-18 ENCOUNTER — Encounter (HOSPITAL_COMMUNITY): Payer: Self-pay

## 2017-09-18 LAB — RPR: RPR Ser Ql: NONREACTIVE

## 2017-09-18 MED ORDER — COCONUT OIL OIL
1.0000 "application " | TOPICAL_OIL | Status: DC | PRN
  Filled 2017-09-18: qty 120

## 2017-09-18 MED ORDER — METHYLERGONOVINE MALEATE 0.2 MG PO TABS: 0.2000 mg | ORAL_TABLET | ORAL | Status: DC | PRN

## 2017-09-18 MED ORDER — BENZOCAINE-MENTHOL 20-0.5 % EX AERO
1.0000 "application " | INHALATION_SPRAY | CUTANEOUS | Status: DC | PRN
  Filled 2017-09-18 (×2): qty 56

## 2017-09-18 MED ORDER — OXYCODONE-ACETAMINOPHEN 5-325 MG PO TABS: 1.0000 | ORAL_TABLET | ORAL | Status: DC | PRN

## 2017-09-18 MED ORDER — DIPHENHYDRAMINE HCL 25 MG PO CAPS: 25.0000 mg | ORAL_CAPSULE | Freq: Four times a day (QID) | ORAL | Status: DC | PRN

## 2017-09-18 MED ORDER — IBUPROFEN 600 MG PO TABS
600.0000 mg | ORAL_TABLET | Freq: Four times a day (QID) | ORAL | Status: DC
  Administered 2017-09-18 – 2017-09-20 (×9): 600 mg via ORAL
  Filled 2017-09-18 (×9): qty 1

## 2017-09-18 MED ORDER — ZOLPIDEM TARTRATE 5 MG PO TABS: 5.0000 mg | ORAL_TABLET | Freq: Every evening | ORAL | Status: DC | PRN

## 2017-09-18 MED ORDER — ONDANSETRON HCL 4 MG PO TABS: 4.0000 mg | ORAL_TABLET | ORAL | Status: DC | PRN

## 2017-09-18 MED ORDER — OXYCODONE-ACETAMINOPHEN 5-325 MG PO TABS: 2.0000 | ORAL_TABLET | ORAL | Status: DC | PRN

## 2017-09-18 MED ORDER — DIBUCAINE 1 % RE OINT
1.0000 "application " | TOPICAL_OINTMENT | RECTAL | Status: DC | PRN
  Filled 2017-09-18: qty 28

## 2017-09-18 MED ORDER — METHYLERGONOVINE MALEATE 0.2 MG/ML IJ SOLN: 0.2000 mg | INTRAMUSCULAR | Status: DC | PRN

## 2017-09-18 MED ORDER — TETANUS-DIPHTH-ACELL PERTUSSIS 5-2.5-18.5 LF-MCG/0.5 IM SUSP: 0.5000 mL | Freq: Once | INTRAMUSCULAR | Status: DC

## 2017-09-18 MED ORDER — SIMETHICONE 80 MG PO CHEW
80.0000 mg | CHEWABLE_TABLET | ORAL | Status: DC | PRN
Start: 2017-09-18 — End: 2017-09-20

## 2017-09-18 MED ORDER — ACETAMINOPHEN 325 MG PO TABS
650.0000 mg | ORAL_TABLET | ORAL | Status: DC | PRN
  Filled 2017-09-18: qty 2

## 2017-09-18 MED ORDER — WITCH HAZEL-GLYCERIN EX PADS: 1.0000 "application " | MEDICATED_PAD | CUTANEOUS | Status: DC | PRN

## 2017-09-18 MED ORDER — PRENATAL MULTIVITAMIN CH
1.0000 | ORAL_TABLET | Freq: Every day | ORAL | Status: DC
  Administered 2017-09-19 – 2017-09-20 (×2): 1 via ORAL
  Filled 2017-09-18 (×2): qty 1

## 2017-09-18 MED ORDER — ONDANSETRON HCL 4 MG/2ML IJ SOLN: 4.0000 mg | INTRAMUSCULAR | Status: DC | PRN

## 2017-09-18 MED ORDER — SENNOSIDES-DOCUSATE SODIUM 8.6-50 MG PO TABS
2.0000 | ORAL_TABLET | ORAL | Status: DC
  Administered 2017-09-18 – 2017-09-19 (×2): 2 via ORAL
  Filled 2017-09-18 (×2): qty 2

## 2017-09-18 MED ORDER — CEFAZOLIN SODIUM-DEXTROSE 2-4 GM/100ML-% IV SOLN
2.0000 g | Freq: Three times a day (TID) | INTRAVENOUS | Status: DC
  Administered 2017-09-18: 2 g via INTRAVENOUS
  Filled 2017-09-18: qty 100

## 2017-09-18 NOTE — Anesthesia Postprocedure Evaluation (Signed)
Anesthesia Post Note  Patient: [name]  Procedure(s) Performed: AN AD HOC LABOR EPIDURAL     Patient location during evaluation: Mother Baby Anesthesia Type: Epidural Level of consciousness: awake and alert Pain management: pain level controlled Vital Signs Assessment: post-procedure vital signs reviewed and stable Respiratory status: spontaneous breathing, nonlabored ventilation and respiratory function stable Cardiovascular status: stable Postop Assessment: no headache, no backache, epidural receding and patient able to bend at knees Anesthetic complications: no    Last Vitals:  Vitals:   09/18/17 1030 09/18/17 1130  BP: 122/73 104/63  Pulse: 92 87  Resp: 18   Temp: 37.6 C 36.7 C  SpO2: 100%     Last Pain:  Vitals:   09/18/17 1130  TempSrc: Oral  PainSc: 0-No pain   Pain Goal: Patients Stated Pain Goal: 2 (09/18/17 1000)               Rica RecordsICKELTON,Shilee Biggs

## 2017-09-18 NOTE — Lactation Note (Signed)
This note was copied from a baby's chart. Lactation Consultation Note  Patient Name: Selena April MansonSamantha Segoviano ZOXWR'UToday's Date: 09/18/2017 Reason for consult: Follow-up assessment;Primapara;1st time breastfeeding;Term  RN Malachi BondsGloria [facility], mom is still having trouble latching baby on. Baby was fussy when entering the room, LC showed mom techniques on how to soothe baby, also showed mom how to burp him, he had a big burp. LC reviewed hand expression with mom and she was able to get a drop of colostrum out of her left breast.  Tried to latch baby in cross cradle position first but baby would not latch, did some suck training and noticed that baby's sucking pattern was very uncoordinated, he kept cueing and opening his mouth but once he got the nipple (or gloved finger in his mouth) he would not suck, he'd mostly chomp/bite, it took at least 2 minutes of suck training to get baby to suck into a rhythmical pattern.  After that, attempted to latch in football position this time and baby latched on after a few tries, mom noticed some tugging but it was an on/off things, baby needed lots of stimulation to sustain the latch. Reviewed tips for a deep latch with mom, baby still nursing when exiting the room. Mom is aware there is only one LC at night and will call her RN for latch assistance.   Maternal Data    Feeding Feeding Type: Breast Fed Length of feed: 7 min(Baby still nursing when exiting the room)  LATCH Score Latch: Repeated attempts needed to sustain latch, nipple held in mouth throughout feeding, stimulation needed to elicit sucking reflex.  Audible Swallowing: A few with stimulation(only 2 swallows heard, probably baby's own saliva)  Type of Nipple: Everted at rest and after stimulation  Comfort (Breast/Nipple): Soft / non-tender  Hold (Positioning): Assistance needed to correctly position infant at breast and maintain latch.  LATCH Score:  7  Interventions Interventions: Breast feeding basics reviewed;Assisted with latch;Skin to skin;Support pillows;Position options;Adjust position;Breast compression;Hand express;Breast massage  Lactation Tools Discussed/Used     Consult Status Consult Status: Follow-up Date: 09/19/17 Follow-up type: In-patient    Broady Lafoy Venetia ConstableS Nelissa Bolduc 09/18/2017, 8:10 PM

## 2017-09-18 NOTE — H&P (Signed)
Selena CatalanSamantha C Carr is an 22 y.o. G1P0 2652w4d female who presents to the ER with SROM. She was scheduled for an induction for a post term preg tomorrow. PNC was uncomplicated. GBS neg NIPT wnl, nl OGTT Chief Complaint: HPI:  Past Medical History:  Diagnosis Date  . Anxiety   . Depression   . Headache   . Keratoconus of left eye   . Mental disorder   . Obesity     Past Surgical History:  Procedure Laterality Date  . CORNEAL TRANSPLANT  2016   per pt report  . WISDOM TOOTH EXTRACTION  Oct 2012   all four    Family History  Problem Relation Age of Onset  . Bipolar disorder Maternal Grandmother    Social History:  reports that she has never smoked. She has never used smokeless tobacco. She reports that she does not drink alcohol or use drugs.  Allergies:  Allergies  Allergen Reactions  . Shrimp [Shellfish Allergy] Anaphylaxis  . Clindamycin/Lincomycin Hives    Medications Prior to Admission  Medication Sig Dispense Refill  . prednisoLONE acetate (PRED FORTE) 1 % ophthalmic suspension Place 1 drop into the right eye daily.  2  . Prenatal Vit-Fe Fumarate-FA (MULTI PRENATAL PO) Take 1 tablet by mouth daily.         Blood pressure 107/62, pulse (!) 103, temperature (!) 100.5 F (38.1 C), temperature source Oral, resp. rate 20, height 5\' 5"  (1.651 m), weight 101.4 kg, last menstrual period 12/08/2016, SpO2 97 %. General appearance: alert and cooperative Abdomen: gravid/nontender  Cx reported 3cm + pool    Lab Results  Component Value Date   WBC 14.6 (H) 09/17/2017   HGB 13.7 09/17/2017   HCT 39.9 09/17/2017   MCV 84.0 09/17/2017   PLT 295 09/17/2017   Lab Results  Component Value Date   PREGTESTUR POSITIVE (A) 01/14/2017   HCG >2,000.0 (H) 01/13/2017     Patient Active Problem List   Diagnosis Date Noted  . Indication for care or intervention related to labor and delivery 09/17/2017  . Post-dates pregnancy 09/17/2017  . Adjustment disorder with mixed  disturbance of emotions and conduct 06/20/2017  . Abnormal urine odor 01/30/2017  . Back pain affecting pregnancy 01/30/2017  . Attention-deficit hyperactivity disorder, combined type 03/07/2011  . Oppositional defiant disorder 03/07/2011  . Post traumatic stress disorder 03/07/2011   Imp/ IUP post term with SROM Plan/admit ANDERSON,MARK E 09/18/2017, 8:00 AM

## 2017-09-18 NOTE — Lactation Note (Signed)
This note was copied from a baby's chart. Lactation Consultation Note  Patient Name: Boy April MansonSamantha Huyett ZOXWR'UToday's Date: 09/18/2017 Reason for consult: Initial assessment;Primapara;1st time breastfeeding;Term  P1 mother whose infant is now 447 hours old.  Baby awake and I offered to assist with latch.  Mother accepted.  Positioned mother appropriately and attempted to latch in the football hold on the left breast without success.  Baby fussy and did not want to attempt to latch. Tried the cross cradle hold with the same results.  Lastly, attempted to latch on the right breast in the football hold with success.  Baby had flanged lips and good rhythmic sucking immediately at breast.  Mother felt tugging but no pain.  Demonstrated breast compressions and how to stimulate baby to help keep him awake at the breast.    Encouraged feeding 8-12 times/24 hours or sooner if he shows feeding cues.  Reviewed feeding cues with mother.  Suggested hand expression before/after feedings.  Colostrum container provided.  Mother did a return demonstration of hand expression but was unable to express any drops at this time.  Mother will call for latch assistance as needed.    Mom made aware of O/P services, breastfeeding support groups, community resources, and our phone # for post-discharge questions. Mother alone at present time.    Maternal Data Formula Feeding for Exclusion: No Has patient been taught Hand Expression?: Yes Does the patient have breastfeeding experience prior to this delivery?: No  Feeding Feeding Type: Breast Fed Length of feed: 12 min(still feeding when I left the room)  LATCH Score Latch: Repeated attempts needed to sustain latch, nipple held in mouth throughout feeding, stimulation needed to elicit sucking reflex.  Audible Swallowing: None  Type of Nipple: Everted at rest and after stimulation(short shafted but baby able to latch-)  Comfort (Breast/Nipple): Soft / non-tender  Hold  (Positioning): Assistance needed to correctly position infant at breast and maintain latch.  LATCH Score: 6  Interventions Interventions: Breast feeding basics reviewed;Assisted with latch;Skin to skin;Breast massage;Hand express;Position options;Support pillows;Adjust position;Breast compression  Lactation Tools Discussed/Used WIC Program: Yes   Consult Status Consult Status: Follow-up Date: 09/19/17 Follow-up type: In-patient    Presleigh Feldstein R Louella Medaglia 09/18/2017, 4:16 PM

## 2017-09-19 LAB — CBC
HCT: 31.8 % — ABNORMAL LOW (ref 36.0–46.0)
Hemoglobin: 10.7 g/dL — ABNORMAL LOW (ref 12.0–15.0)
MCH: 29 pg (ref 26.0–34.0)
MCHC: 33.6 g/dL (ref 30.0–36.0)
MCV: 86.2 fL (ref 78.0–100.0)
PLATELETS: 261 10*3/uL (ref 150–400)
RBC: 3.69 MIL/uL — AB (ref 3.87–5.11)
RDW: 14.9 % (ref 11.5–15.5)
WBC: 11.2 10*3/uL — AB (ref 4.0–10.5)

## 2017-09-19 MED ORDER — RHO D IMMUNE GLOBULIN 1500 UNIT/2ML IJ SOSY
300.0000 ug | PREFILLED_SYRINGE | Freq: Once | INTRAMUSCULAR | Status: AC
  Administered 2017-09-19: 300 ug via INTRAVENOUS
  Filled 2017-09-19: qty 2

## 2017-09-19 NOTE — Progress Notes (Signed)
Post Partum Day 1 Subjective: no complaints, up ad lib, voiding and tolerating PO  Objective: Blood pressure 106/63, pulse 78, temperature 97.9 F (36.6 C), temperature source Oral, resp. rate 18, height 5\' 5"  (1.651 m), weight 101.4 kg, last menstrual period 12/08/2016, SpO2 100 %, unknown if currently breastfeeding.  Physical Exam:  General: alert, cooperative and appears stated age Lochia: appropriate Uterine Fundus: firm DVT Evaluation: No evidence of DVT seen on physical exam.  Recent Labs    09/17/17 1337 09/19/17 0606  HGB 13.7 10.7*  HCT 39.9 31.8*    Assessment/Plan: Plan for discharge tomorrow   LOS: 2 days   [name] 09/19/2017, 11:56 AM

## 2017-09-20 LAB — RH IG WORKUP (INCLUDES ABO/RH)
ABO/RH(D): O NEG
Fetal Screen: NEGATIVE
GESTATIONAL AGE(WKS): 40.4
Unit division: 0

## 2017-09-20 MED ORDER — DOCUSATE SODIUM 100 MG PO CAPS: 100.0000 mg | ORAL_CAPSULE | Freq: Two times a day (BID) | ORAL | 0 refills | Status: DC

## 2017-09-20 MED ORDER — IBUPROFEN 600 MG PO TABS: 600.0000 mg | ORAL_TABLET | Freq: Four times a day (QID) | ORAL | 0 refills | Status: DC | PRN

## 2017-09-20 NOTE — Discharge Summary (Signed)
Obstetric Discharge Summary Reason for Admission: onset of labor Prenatal Procedures: NST and ultrasound Intrapartum Procedures: spontaneous vaginal delivery Postpartum Procedures: none Complications-Operative and Postpartum: none Hemoglobin  Date Value Ref Range Status  09/19/2017 10.7 (L) 12.0 - 15.0 g/dL Final    Comment:    REPEATED TO VERIFY DELTA CHECK NOTED    HCT  Date Value Ref Range Status  09/19/2017 31.8 (L) 36.0 - 46.0 % Final    Physical Exam:  General: alert, cooperative and appears stated age 34Lochia: appropriate Uterine Fundus: firm DVT Evaluation: No evidence of DVT seen on physical exam.  Discharge Diagnoses: Term Pregnancy-delivered  Discharge Information: Date: 09/20/2017 Activity: pelvic rest Diet: routine Medications: Ibuprofen and Colace Condition: improved Instructions: refer to practice specific booklet Discharge to: home Follow-up Information    Waynard Reedsoss, Conan Mcmanaway, MD Follow up in 4 week(s).   Specialty:  Obstetrics and Gynecology Contact information: [address] [place] [address] [phone]           Newborn Data: Live born female  Birth Weight: 6 lb 9.3 oz (2985 g) APGAR: 9, 9  Newborn Delivery   Birth date/time:  09/18/2017 08:38:00 Delivery type:  Vaginal, Spontaneous     Home with mother.  Waynard ReedsKendra Idalee Foxworthy 09/20/2017, 11:43 AM

## 2017-09-20 NOTE — Lactation Note (Signed)
This note was copied from a baby's chart. Lactation Consultation Note  Patient Name: Boy April MansonSamantha Shirk GNFAO'ZToday's Date: 09/20/2017   Visited with P1 Mom and FOB on day of discharge.  Baby at 49 hrs old and 3% weight loss.  Baby has been primarily getting formula by bottles.   Mom looking at her phone, but FOB communicated with LC that baby likes the formula better.  Multiple formula bottles in room.  Encouraged Mom to double pump (has DEBP at home) when baby gets formula if she wants to keep option open to breastfeed.    Explained importance of offering breast first, and double pumping 15-20 mins whenever baby obtains formula.  Engorgement prevention and treatment discussed.   Mom aware of OP lactation services available to her, and encouraged her to call prn.    Judee ClaraSmith, Denzel Etienne E 09/20/2017, 10:07 AM

## 2017-09-20 NOTE — Progress Notes (Signed)
CSW received consult for [name] due to history of anxiety, depression, and EPDS score of 10. CSW spoke with [name] to complete discussion regarding history. [name] confirms a history of anxiety and depression but is not currently on any medication. [name] reports that she has been on medication before and had a counselor but has nothing recently. [name] reports that this is her first baby so she just felt overwhelmed at the time she completed the assessment. [name] reported a good support system with her FOB [name] and family. [name] and CSW discussed baby blues period versus postpartum depression, [name] stated understanding. [name] stated she felt comfortable reaching out to an outpatient agency to resume therapy services. CSW encouraged [name] to reach out for assistance if needs or questions arise regarding outpatient therapy. CSW offered to provide [name] with a list of providers but she declined. No further issues or concerns to address at this time.  [name], MSW, LCSW-A Clinical Social Worker [facility] [phone]

## 2017-09-20 NOTE — Progress Notes (Signed)
Patient reports tenderness in perineal area that "burns more when pee goes over it". Left interior labia appears to have a small area of exposed skin. Patient was instructed on use of a sitz bath. She is using her peri bottle and dermoplast spray. Encouraged patient to drink fluids. Request that she inform her MD on rounds.

## 2018-03-08 ENCOUNTER — Inpatient Hospital Stay (HOSPITAL_COMMUNITY)
Admission: AD | Admit: 2018-03-08 | Discharge: 2018-03-15 | DRG: 885 | Disposition: A | Discharge status: Home / Self Care | Payer: Medicaid Other | Attending: Psychiatry | Admitting: Psychiatry

## 2018-03-08 ENCOUNTER — Encounter (HOSPITAL_COMMUNITY): Payer: Self-pay

## 2018-03-08 ENCOUNTER — Other Ambulatory Visit: Payer: Self-pay

## 2018-03-08 ENCOUNTER — Emergency Department (HOSPITAL_COMMUNITY)
Admission: EM | Admit: 2018-03-08 | Discharge: 2018-03-08 | Disposition: A | Discharge status: Other Acute Inpatient Hospital | Payer: Medicaid Other | Attending: Emergency Medicine | Admitting: Emergency Medicine

## 2018-03-08 DIAGNOSIS — F314 Bipolar disorder, current episode depressed, severe, without psychotic features: Secondary | ICD-10-CM | POA: Diagnosis present

## 2018-03-08 DIAGNOSIS — Z881 Allergy status to other antibiotic agents status: Secondary | ICD-10-CM

## 2018-03-08 DIAGNOSIS — F53 Postpartum depression: Secondary | ICD-10-CM | POA: Insufficient documentation

## 2018-03-08 DIAGNOSIS — F332 Major depressive disorder, recurrent severe without psychotic features: Secondary | ICD-10-CM

## 2018-03-08 DIAGNOSIS — F419 Anxiety disorder, unspecified: Secondary | ICD-10-CM | POA: Diagnosis present

## 2018-03-08 DIAGNOSIS — R45851 Suicidal ideations: Secondary | ICD-10-CM | POA: Insufficient documentation

## 2018-03-08 DIAGNOSIS — Z91013 Allergy to seafood: Secondary | ICD-10-CM | POA: Diagnosis not present

## 2018-03-08 DIAGNOSIS — Z947 Corneal transplant status: Secondary | ICD-10-CM

## 2018-03-08 DIAGNOSIS — Z818 Family history of other mental and behavioral disorders: Secondary | ICD-10-CM | POA: Diagnosis not present

## 2018-03-08 DIAGNOSIS — Z915 Personal history of self-harm: Secondary | ICD-10-CM | POA: Diagnosis not present

## 2018-03-08 DIAGNOSIS — Z23 Encounter for immunization: Secondary | ICD-10-CM

## 2018-03-08 DIAGNOSIS — F331 Major depressive disorder, recurrent, moderate: Secondary | ICD-10-CM | POA: Insufficient documentation

## 2018-03-08 DIAGNOSIS — O99345 Other mental disorders complicating the puerperium: Secondary | ICD-10-CM

## 2018-03-08 LAB — I-STAT BETA HCG BLOOD, ED (MC, WL, AP ONLY)

## 2018-03-08 LAB — COMPREHENSIVE METABOLIC PANEL
ALT: 41 U/L (ref 0–44)
ANION GAP: 8 (ref 5–15)
AST: 27 U/L (ref 15–41)
Albumin: 4.3 g/dL (ref 3.5–5.0)
Alkaline Phosphatase: 62 U/L (ref 38–126)
BUN: 13 mg/dL (ref 6–20)
CHLORIDE: 110 mmol/L (ref 98–111)
CO2: 20 mmol/L — AB (ref 22–32)
CREATININE: 0.55 mg/dL (ref 0.44–1.00)
Calcium: 8.9 mg/dL (ref 8.9–10.3)
Glucose, Bld: 99 mg/dL (ref 70–99)
POTASSIUM: 3.8 mmol/L (ref 3.5–5.1)
SODIUM: 138 mmol/L (ref 135–145)
Total Bilirubin: 0.4 mg/dL (ref 0.3–1.2)
Total Protein: 7.4 g/dL (ref 6.5–8.1)

## 2018-03-08 LAB — CBC
HCT: 41.9 % (ref 36.0–46.0)
Hemoglobin: 13.8 g/dL (ref 12.0–15.0)
MCH: 28.8 pg (ref 26.0–34.0)
MCHC: 32.9 g/dL (ref 30.0–36.0)
MCV: 87.5 fL (ref 80.0–100.0)
NRBC: 0 % (ref 0.0–0.2)
PLATELETS: 394 10*3/uL (ref 150–400)
RBC: 4.79 MIL/uL (ref 3.87–5.11)
RDW: 13.7 % (ref 11.5–15.5)
WBC: 11.9 10*3/uL — AB (ref 4.0–10.5)

## 2018-03-08 LAB — RAPID URINE DRUG SCREEN, HOSP PERFORMED
AMPHETAMINES: NOT DETECTED
BENZODIAZEPINES: NOT DETECTED
Barbiturates: NOT DETECTED
Cocaine: NOT DETECTED
OPIATES: NOT DETECTED
Tetrahydrocannabinol: NOT DETECTED

## 2018-03-08 LAB — ACETAMINOPHEN LEVEL

## 2018-03-08 LAB — SALICYLATE LEVEL

## 2018-03-08 LAB — ETHANOL

## 2018-03-08 MED ORDER — ACETAMINOPHEN 325 MG PO TABS
650.0000 mg | ORAL_TABLET | Freq: Four times a day (QID) | ORAL | Status: DC | PRN
  Administered 2018-03-11 – 2018-03-12 (×2): 650 mg via ORAL
  Filled 2018-03-08 (×2): qty 2

## 2018-03-08 MED ORDER — INFLUENZA VAC SPLIT QUAD 0.5 ML IM SUSY
0.5000 mL | PREFILLED_SYRINGE | INTRAMUSCULAR | Status: AC
  Administered 2018-03-09: 0.5 mL via INTRAMUSCULAR
  Filled 2018-03-08: qty 0.5

## 2018-03-08 MED ORDER — MAGNESIUM HYDROXIDE 400 MG/5ML PO SUSP: 30.0000 mL | Freq: Every day | ORAL | Status: DC | PRN

## 2018-03-08 MED ORDER — TRAZODONE HCL 50 MG PO TABS
50.0000 mg | ORAL_TABLET | Freq: Every evening | ORAL | Status: DC | PRN
  Administered 2018-03-09: 50 mg via ORAL
  Filled 2018-03-08 (×6): qty 1

## 2018-03-08 MED ORDER — ALUM & MAG HYDROXIDE-SIMETH 200-200-20 MG/5ML PO SUSP: 30.0000 mL | ORAL | Status: DC | PRN

## 2018-03-08 MED ORDER — HYDROXYZINE HCL 25 MG PO TABS
25.0000 mg | ORAL_TABLET | Freq: Three times a day (TID) | ORAL | Status: DC | PRN
  Administered 2018-03-08 – 2018-03-10 (×3): 25 mg via ORAL
  Filled 2018-03-08 (×3): qty 1

## 2018-03-08 MED ORDER — PREDNISOLONE ACETATE 1 % OP SUSP
1.0000 [drp] | Freq: Every day | OPHTHALMIC | Status: DC
  Administered 2018-03-09 – 2018-03-15 (×7): 1 [drp] via OPHTHALMIC
  Filled 2018-03-08: qty 1

## 2018-03-08 NOTE — Progress Notes (Addendum)
Pt accepted to [facility], Bed 403-1  Nanine Means, NP is the accepting provider.  Landry Mellow, MD is the attending provider.  Call report to [phone]  [facility] notified.   Pt is IVC Pt may be transported by [facility]  Pt scheduled  to arrive at [facility] At 14:00  Carney Bern T. Kaylyn Lim, MSW, LCSWA Disposition Clinical Social Work [phone] (cell) [phone] (office)
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
## 2018-03-08 NOTE — ED Notes (Signed)
Pt given orange juice and a turkey sandwich.

## 2018-03-08 NOTE — Plan of Care (Signed)
  Problem: Education: Goal: Knowledge of [facility] Education information/materials will improve Outcome: Progressing Goal: Verbalization of understanding the information provided will improve Outcome: Progressing   Problem: Education: Goal: Emotional status will improve Outcome: Not Progressing Goal: Mental status will improve Outcome: Not Progressing   Problem: Activity: Goal: Interest or engagement in activities will improve Outcome: Not Progressing

## 2018-03-08 NOTE — ED Notes (Signed)
Pt placed in purple/wine scrubs, wanded by security, belongings placed in two white bags and placed under the ice machine in triage.

## 2018-03-08 NOTE — Tx Team (Signed)
Initial Treatment Plan 03/08/2018 5:38 PM [name] [id number]    PATIENT STRESSORS: Marital or family conflict Traumatic event   PATIENT STRENGTHS: Ability for insight Average or above average intelligence Capable of independent living General fund of knowledge Motivation for treatment/growth   PATIENT IDENTIFIED PROBLEMS: Depression Panic attacks "PPD"                     DISCHARGE CRITERIA:  Ability to meet basic life and health needs Adequate post-discharge living arrangements Improved stabilization in mood, thinking, and/or behavior  PRELIMINARY DISCHARGE PLAN: Outpatient therapy  PATIENT/FAMILY INVOLVEMENT: This treatment plan has been presented to and reviewed with the patient, [name].  The patient and family have been given the opportunity to ask questions and make suggestions.  Dewayne Shorter, RN 03/08/2018, 5:38 PM

## 2018-03-08 NOTE — ED Notes (Signed)
Pt alert and oriented. Pt denies any pain or discomfort at this time. Pt denies any si, hi ,or avh. Pt cooperative and tearful. Pt states " I just want to be with my baby". Pt resting quietly in the bed. Pt safe, will continue to monitor.

## 2018-03-08 NOTE — ED Notes (Signed)
Bed: [facility] Expected date:  Expected time:  Means of arrival:  Comments: TR 6

## 2018-03-08 NOTE — ED Notes (Signed)
Pt discharged safely with [employer].  All belongings were sent with patient. 

## 2018-03-08 NOTE — ED Notes (Signed)
Bed: WTR6 Expected date:  Expected time:  Means of arrival:  Comments: 

## 2018-03-08 NOTE — BH Assessment (Signed)
[facility] Assessment Progress Note  Per Juanetta Beets, DO, this pt requires psychiatric hospitalization.  Brook, RN, [facility] has assigned pt to [facility] Rm 403-1.  Pt presents under IVC initiated by pt's father, and upheld by Dr Sharma Covert, and IVC documents have been faxed to [facility].  Pt's nurse, Kendal Hymen, has been notified, and agrees to call report to [phone].  Pt is to be transported via [employer].   Doylene Canning, [place] [facility] Coordinator [phone]

## 2018-03-08 NOTE — ED Notes (Signed)
Patient is alert and oriented x 4.  Presents with anxious affect and mood.  Denies suicidal thoughts, auditory and visual hallucinations.  Patient tearful, restless and preoccupied with getting discharge.  Routine safety checks maintained every 15 minutes and via security camera.  Support and encouragement offered as needed.  Patient is safe on the unit.

## 2018-03-08 NOTE — ED Provider Notes (Signed)
[facility]-EMERGENCY DEPT Provider Note   CSN: [id number] Arrival date & time: 03/08/18  0036     History   Chief Complaint Chief Complaint  Patient presents with  . Medical Clearance    IVC    HPI [name] is a 23 y.o. female.  The history is provided by the patient and medical records.     23 y.o. F with hx of anxiety, depression, headaches, adjust disorder, PTSD, obesity, presenting to the ED under IVC petitioned by her dad.  States she has always had issues with anxiety/depression, but has been off medications for several years.  States since having the baby, she has had worsening symptoms consistent with post-partum depression.  She had appt with mental services but has been postponing this due to several appts for her son.  Family was not hapy with this decision and felt like she was avoiding getting help.  Apparently, dad, brother, and father of baby conspired together today to get her some help.  States she was on the couch with the baby today and noticed that her boyfriend and brother kept leaving the room with the baby.  States she started getting very paranoid about this and was not understanding why.  States she is not had any thoughts of harming her baby whatsoever and has been taking care of him around the clock since he was born.  States she started feeling very anxious when all this was happening because she overheard them talking about her. Father apparently gave her the choice of voluntary versus involuntary psychiatric treatment.  Patient states this pushed her further and she just needed to get out of the house so she attempted to go for a walk as this generally calms her down.  Apparently her father viewed this as her "escaping" and took out IVC papers on her.  She denies SI/HI/AVH.  Past Medical History:  Diagnosis Date  . Anxiety   . Depression   . Headache   . Keratoconus of left eye   . Mental disorder   . Obesity     Patient  Active Problem List   Diagnosis Date Noted  . Spontaneous vaginal delivery 09/18/2017  . Indication for care or intervention related to labor and delivery 09/17/2017  . Post-dates pregnancy 09/17/2017  . 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 Abnormal urine odor 01/30/2017  . Back pain affecting pregnancy 01/30/2017  . Attention-deficit hyperactivity disorder, combined type 03/07/2011  . Oppositional defiant disorder 03/07/2011  . Post traumatic stress disorder 03/07/2011    Past Surgical History:  Procedure Laterality Date  . CORNEAL TRANSPLANT  2016   per pt report  . WISDOM TOOTH EXTRACTION  Oct 2012   all four     OB History    Gravida  1   Para  1   Term  1   Preterm      AB      Living  1     SAB      TAB      Ectopic      Multiple  0   Live Births  1            Home Medications    Prior to Admission medications   Medication Sig Start Date End Date Taking? Authorizing Provider  docusate sodium (COLACE) 100 MG capsule Take 1 capsule (100 mg total) by mouth 2 (two) times daily. 09/20/17   Waynard Reedsoss, Kendra, MD  ibuprofen (ADVIL,MOTRIN) 600 MG tablet Take 1 tablet (600 mg total) by mouth every 6 (six) hours as needed. 09/20/17   Waynard Reeds, MD  Prenatal Vit-Fe Fumarate-FA [facility] PRENATAL PO) Take 1 tablet by mouth daily.    [provider]    Family History Family History  Problem Relation Age of Onset  . Bipolar disorder Maternal Grandmother     Social History Social History   Tobacco Use  . Smoking status: Never Smoker  . Smokeless tobacco: Never Used  Substance Use Topics  . Alcohol use: No  . Drug use: No     Allergies   Shrimp [shellfish allergy] and Clindamycin/lincomycin   Review of Systems Review of Systems  Psychiatric/Behavioral: Positive for suicidal ideas.  All other systems reviewed and are negative.    Physical Exam Updated Vital Signs BP 135/80 (BP Location: Left  Arm)   Pulse 100   Temp 98.5 F (36.9 C) (Oral)   Resp 17   SpO2 99%   Physical Exam Vitals signs and nursing note reviewed.  Constitutional:      Appearance: She is well-developed.  HENT:     Head: Normocephalic and atraumatic.  Eyes:     Conjunctiva/sclera: Conjunctivae normal.     Pupils: Pupils are equal, round, and reactive to light.  Neck:     Musculoskeletal: Normal range of motion.  Cardiovascular:     Rate and Rhythm: Normal rate and regular rhythm.     Heart sounds: Normal heart sounds.  Pulmonary:     Effort: Pulmonary effort is normal.     Breath sounds: Normal breath sounds.  Abdominal:     General: Bowel sounds are normal.     Palpations: Abdomen is soft.  Musculoskeletal: Normal range of motion.  Skin:    General: Skin is warm and dry.  Neurological:     Mental Status: She is alert and oriented to person, place, and time.  Psychiatric:     Comments: Crying throughout exam Denies SI/HI/AVH      ED Treatments / Results  Labs (all labs ordered are listed, but only abnormal results are displayed) Labs Reviewed  COMPREHENSIVE METABOLIC PANEL - Abnormal; Notable for the following components:      Result Value   CO2 20 (*)    All other components within normal limits  ACETAMINOPHEN LEVEL - Abnormal; Notable for the following components:   Acetaminophen (Tylenol), Serum <10 (*)    All other components within normal limits  CBC - Abnormal; Notable for the following components:   WBC 11.9 (*)    All other components within normal limits  ETHANOL  SALICYLATE LEVEL  RAPID URINE DRUG SCREEN, HOSP PERFORMED  I-STAT BETA HCG BLOOD, ED ([facility], [facility], [facility] ONLY)    EKG None  Radiology No results found.  Procedures Procedures (including critical care time)  Medications Ordered in ED Medications - No data to display   Initial Impression / Assessment and Plan / ED Course  I have reviewed the triage vital signs and the nursing notes.  Pertinent labs &  imaging results that were available during my care of the patient were reviewed by me and considered in my medical decision making (see chart for details).  24 year old female here under IVC petition by her father.  Apparently she has been battling with some postpartum depression and has been delaying her appointments for follow-up due to appointments for her newborn son.  She is tearful here and clearly upset.  She denies any suicidal  homicidal ideation.  No hallucinations.  No thoughts of harm or neglect to her baby.  No drug or EtOH use.  Screening labs reassuring.  Medically cleared.  TTS has evaluated-- recommends overnight observation and reassessment by psychiatry in the morning.  Final Clinical Impressions(s) / ED Diagnoses   Final diagnoses:  Postpartum depression    ED Discharge Orders    None       Garlon HatchetSanders, Shriyan Arakawa M, PA-C 03/08/18 [id number]    Shaune PollackIsaacs, Cameron, MD 03/09/18 [phone]

## 2018-03-08 NOTE — ED Triage Notes (Signed)
IVC paperwork states, "respondent is bipolar. She has been talking about killing herself and has been running in and out of the street. She told her father/petitioner that she is tired of living the way she is living. Respondent said she is tired of being depressed and wants to die. When she is depressed, she drinks whole bottles of wine. Respondent has been prescribed prozac and tramadol and stopped taking it over a year ago." Pt denies SI at this time, but is tearful in triage. Pt is postpartum and not medicated.

## 2018-03-09 DIAGNOSIS — F314 Bipolar disorder, current episode depressed, severe, without psychotic features: Principal | ICD-10-CM

## 2018-03-09 MED ORDER — BUPROPION HCL ER (XL) 150 MG PO TB24
150.0000 mg | ORAL_TABLET | Freq: Every day | ORAL | Status: DC
  Administered 2018-03-09 – 2018-03-14 (×6): 150 mg via ORAL
  Filled 2018-03-09 (×8): qty 1

## 2018-03-09 MED ORDER — LAMOTRIGINE 25 MG PO TABS
25.0000 mg | ORAL_TABLET | Freq: Every day | ORAL | Status: DC
  Administered 2018-03-09 – 2018-03-15 (×7): 25 mg via ORAL
  Filled 2018-03-09 (×9): qty 1

## 2018-03-09 NOTE — Progress Notes (Signed)
Recreation Therapy Notes  Animal-Assisted Activity (AAA) Program Checklist/Progress Notes Patient Eligibility Criteria Checklist & Daily Group note for Rec Tx Intervention  Date: 2.4.20 Time: 1430 Location: [address]   AAA/T Program Assumption of Risk Form signed by Engineer, production or Parent Legal Guardian  YES   Patient is free of allergies or sever asthma   YES   Patient reports no fear of animals  YES   Patient reports no history of cruelty to animals  YES   Patient understands his/her participation is voluntary  YES   Patient washes hands before animal contact  YES   Patient washes hands after animal contact  YES   Behavioral Response: Engaged  Education: Charity fundraiser, Appropriate Animal Interaction   Education Outcome: Acknowledges understanding/In group clarification offered/Needs additional education.   Clinical Observations/Feedback: Pt attended and participated in group.    Caroll Rancher, LRT/CTRS         Caroll Rancher A 03/09/2018 3:43 PM

## 2018-03-09 NOTE — Progress Notes (Signed)
Pt presents with a flat affect and an anxious mood. Pt expressed having increased anxiety since giving birth. Pt expressed needing to get on medications for depression, anxiety, paranoia and PTSD. Pt denies SI/HI. Pt rates depression 6/10. Anxiety 5/10. Hopelessness 5/10. Pt reported fair sleep last night but stated that the sleeping medication was too heavy and made her feel tired this morning.   Medications reviewed with pt. Verbal support provided. Pt encouraged to attend groups. 15 minute checks performed for safety.   Pt compliant with tx plan.
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
## 2018-03-09 NOTE — Progress Notes (Signed)
The patient expressed in group that she was upbeat since her medications were re-started today. Her goal for tomorrow is to work on her coping skills and to try to not worry as much.

## 2018-03-09 NOTE — Progress Notes (Signed)
Patient attended grief and loss group facilitated by Burnis KingfisherMatthew Stalnaker, Mdiv, [facility], and Ethel Ranaaroline Lashona Schaaf, MS, [facility], [facility].   Group focuses on change and loss experienced by group members; topics include loss due to death, loss of relationships, loss of a place, loss of sense of self, etc. Group members are invited to share the changes and losses that are currently affecting their lives. Facilitators tie together shared experiences between group members and validate emotions felt by participants.  Patient [name] attended and participated in group. Pt shared she has had a lot of loss in her life, dating back to her childhood. She identified feelings of numbness to protect herself as opposed to making space to grieve. Pt shared her grandmother is currently ill and she feels that she has created some distance between herself and her grandmother in anticipation of her grandmother's death; her family is frustrated and unsupportive of her decision. Pt reports a need to prioritize her energy. Pt identified her current priority as reunification with her child who is involved with CPS. Pt is frustrated with her family for sending conflicting messages by [facility] and expecting her to be there for her child at the same time.

## 2018-03-09 NOTE — Progress Notes (Signed)
The patient rated her day as a 3 out of a possible 10. She explained that she had a bad day overall and that she missed her baby.
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
## 2018-03-09 NOTE — Progress Notes (Signed)
D: Patient observed in the dayroom interacting with peers. Somewhat cautious on approach. During 1:1 with this writer patient states, "there's just been so much going on. I have a 15 month old and this is the first time I've been away from him for any length of time. And now I have a CPS report to worry about. I'm not an unfit parent. But I will do what I need to do." Patient's affect flat, sad and worried with congruent mood. Patient refuses trazadone as she states she sleeps without difficulty but does request vistaril for her nerves.  Denies pain, physical complaints.   A: Medicated per orders, prn vistaril given. Medication education provided. Level III obs in place for safety. Emotional support offered. Patient encouraged to complete Suicide Safety Plan before discharge. Encouraged to attend and participate in unit programming.    R: Patient verbalizes understanding of POC. On reassess, patient is resting in bed. Patient denies SI/HI/AVH and remains safe on level III obs. Will continue to monitor throughout the night.

## 2018-03-10 DIAGNOSIS — F332 Major depressive disorder, recurrent severe without psychotic features: Secondary | ICD-10-CM

## 2018-03-10 LAB — TSH: TSH: 1.788 u[IU]/mL (ref 0.350–4.500)

## 2018-03-10 MED ORDER — TRAZODONE 25 MG HALF TABLET
25.0000 mg | ORAL_TABLET | Freq: Every evening | ORAL | Status: DC | PRN
  Administered 2018-03-10: 25 mg via ORAL
  Filled 2018-03-10 (×4): qty 1

## 2018-03-10 NOTE — Tx Team (Signed)
Interdisciplinary Treatment and Diagnostic Plan Update  03/10/2018 Time of Session:  [name] MRN: [id number]  Principal Diagnosis: <principal problem not specified>  Secondary Diagnoses: Active Problems:   Bipolar affective disorder, depressed, severe (HCC)   Current Medications:  Current Facility-Administered Medications  Medication Dose Route Frequency Provider Last Rate Last Dose  . acetaminophen (TYLENOL) tablet 650 mg  650 mg Oral Q6H PRN Patrecia Pour, NP      . alum & mag hydroxide-simeth (MAALOX/MYLANTA) 200-200-20 MG/5ML suspension 30 mL  30 mL Oral Q4H PRN Patrecia Pour, NP      . buPROPion (WELLBUTRIN XL) 24 hr tablet 150 mg  150 mg Oral Daily Cobos, Myer Peer, MD   150 mg at 03/10/18 0805  . hydrOXYzine (ATARAX/VISTARIL) tablet 25 mg  25 mg Oral TID PRN Lindon Romp A, NP   25 mg at 03/09/18 1402  . lamoTRIgine (LAMICTAL) tablet 25 mg  25 mg Oral Daily Cobos, Myer Peer, MD   25 mg at 03/10/18 0805  . magnesium hydroxide (MILK OF MAGNESIA) suspension 30 mL  30 mL Oral Daily PRN Patrecia Pour, NP      . prednisoLONE acetate (PRED FORTE) 1 % ophthalmic suspension 1 drop  1 drop Right Eye Daily Patrecia Pour, NP   1 drop at 03/10/18 0805  . traZODone (DESYREL) tablet 50 mg  50 mg Oral QHS,MR X 1 Lindon Romp A, NP   50 mg at 03/09/18 2137   PTA Medications: Medications Prior to Admission  Medication Sig Dispense Refill Last Dose  . prednisoLONE acetate (PRED FORTE) 1 % ophthalmic suspension Place 1 drop into the right eye daily.   03/07/2018 at Unknown time    Patient Stressors: Marital or family conflict Traumatic event  Patient Strengths: Ability for insight Average or above average intelligence Capable of independent living General fund of knowledge Motivation for treatment/growth  Treatment Modalities: Medication Management, Group therapy, Case management,  1 to 1 session with clinician, Psychoeducation, Recreational therapy.   Physician  Treatment Plan for Primary Diagnosis: <principal problem not specified> Long Term Goal(s): Improvement in symptoms so as ready for discharge Improvement in symptoms so as ready for discharge   Short Term Goals: Ability to identify changes in lifestyle to reduce recurrence of condition will improve Ability to verbalize feelings will improve Ability to disclose and discuss suicidal ideas Ability to demonstrate self-control will improve Ability to identify and develop effective coping behaviors will improve Ability to maintain clinical measurements within normal limits will improve Ability to identify changes in lifestyle to reduce recurrence of condition will improve Ability to maintain clinical measurements within normal limits will improve  Medication Management: Evaluate patient's response, side effects, and tolerance of medication regimen.  Therapeutic Interventions: 1 to 1 sessions, Unit Group sessions and Medication administration.  Evaluation of Outcomes: Not Met  Physician Treatment Plan for Secondary Diagnosis: Active Problems:   Bipolar affective disorder, depressed, severe ([place])  Long Term Goal(s): Improvement in symptoms so as ready for discharge Improvement in symptoms so as ready for discharge   Short Term Goals: Ability to identify changes in lifestyle to reduce recurrence of condition will improve Ability to verbalize feelings will improve Ability to disclose and discuss suicidal ideas Ability to demonstrate self-control will improve Ability to identify and develop effective coping behaviors will improve Ability to maintain clinical measurements within normal limits will improve Ability to identify changes in lifestyle to reduce recurrence of condition will improve Ability to maintain clinical measurements  within normal limits will improve     Medication Management: Evaluate patient's response, side effects, and tolerance of medication regimen.  Therapeutic  Interventions: 1 to 1 sessions, Unit Group sessions and Medication administration.  Evaluation of Outcomes: Not Met   RN Treatment Plan for Primary Diagnosis: <principal problem not specified> Long Term Goal(s): Knowledge of disease and therapeutic regimen to maintain health will improve  Short Term Goals: Ability to participate in decision making will improve, Ability to verbalize feelings will improve, Ability to disclose and discuss suicidal ideas, Ability to identify and develop effective coping behaviors will improve and Compliance with prescribed medications will improve  Medication Management: RN will administer medications as ordered by provider, will assess and evaluate patient's response and provide education to patient for prescribed medication. RN will report any adverse and/or side effects to prescribing provider.  Therapeutic Interventions: 1 on 1 counseling sessions, Psychoeducation, Medication administration, Evaluate responses to treatment, Monitor vital signs and CBGs as ordered, Perform/monitor CIWA, COWS, AIMS and Fall Risk screenings as ordered, Perform wound care treatments as ordered.  Evaluation of Outcomes: Not Met   LCSW Treatment Plan for Primary Diagnosis: <principal problem not specified> Long Term Goal(s): Safe transition to appropriate next level of care at discharge, Engage patient in therapeutic group addressing interpersonal concerns.  Short Term Goals: Engage patient in aftercare planning with referrals and resources  Therapeutic Interventions: Assess for all discharge needs, 1 to 1 time with Social worker, Explore available resources and support systems, Assess for adequacy in community support network, Educate family and significant other(s) on suicide prevention, Complete Psychosocial Assessment, Interpersonal group therapy.  Evaluation of Outcomes: Not Met   Progress in Treatment: Attending groups: Yes. Participating in groups: Yes. Taking  medication as prescribed: Yes. Toleration medication: Yes. Family/Significant other contact made: No, will contact:  the patient's boyfriend  Patient understands diagnosis: Yes. Discussing patient identified problems/goals with staff: Yes. Medical problems stabilized or resolved: Yes. Denies suicidal/homicidal ideation: Yes. Issues/concerns per patient self-inventory: No. Other:   New problem(s) identified: None   New Short Term/Long Term Goal(s):Detox, medication stabilization, elimination of SI thoughts, development of comprehensive mental wellness plan.   Patient Goals:  Help with depression and panic attacks   Discharge Plan or Barriers: Patient plans to discharge home with her boyfriend and 8 months old son to a new home. She reports she is following up with [facility] for outpatient medication management and therapy services.   Reason for Continuation of Hospitalization: Anxiety Depression Medication stabilization Suicidal ideation  Estimated Length of Stay: 03/15/2018  Attendees: Patient: 03/10/2018 8:39 AM  Physician: Dr. Neita Garnet, MD 03/10/2018 8:39 AM  Nursing: Rise Paganini.Raliegh Ip, RN 03/10/2018 8:39 AM  RN Care Manager: 03/10/2018 8:39 AM  Social Worker: Radonna Ricker, [place] 03/10/2018 8:39 AM  Recreational Therapist:  03/10/2018 8:39 AM  Other: Harriett Sine, NP  03/10/2018 8:39 AM  Other:  03/10/2018 8:39 AM  Other: 03/10/2018 8:39 AM    Scribe for Treatment Team: Marylee Floras, [place] 03/10/2018 8:39 AM

## 2018-03-10 NOTE — Progress Notes (Signed)
Pt attended wrap-up group.Pt appears depressed/sad/anxious in affect and mood. Pt denies SI/HI/AVH/Pain at this time. Pt states feels sad and angry that she is not going to be allow to see her son anymore.Therapeutic discussion given to Pt 1:1; Pt appears receptive. Pt requesting to speak with social worker tomorrow.Will continue with POC.

## 2018-03-10 NOTE — Progress Notes (Signed)
Patient's self inventory sheet, patient has poor sleep, sleep medication not helpful.  Good appetite, normal energy level, good concentration.  Rated depression 5, hopeless 3, anxiety 2.  Denied withdrawals.  Denied SI.  Denied physical problems.  Denied physical pain.  Goal is stabilize anxiety and over thinking.  Plans to find the positive in things and find distraction for anxiety/thinking.  Medications administered per MD orders.  Emotional support and encouragement given patient. Safety maintained with 15 minute checks. Patient denied SI and HI, contracts for safety.  Denied A/V hallucianaions.  Safety maintained with 15 minute checks.
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
## 2018-03-10 NOTE — Progress Notes (Addendum)
Patient sitting on bed crying.  Patient's boyfriend called and told her CPS is involved with their 32 month old son.  That patient cannot live with him any longer because CPS went to court and said that she is danger to their child and to herself.  Patient's parents were taking care of the child.  Then boyfriend told court that his parents would be better for the child.  So boyfriend's parents will be taking care of child now.  Boyfriend told patient that she cannot live with him, cannot see the child, that she probably will not be able to see the child for one year.  Boyfriend told patient that her parents had video of her stating she was going to hurt herself and the child.  That he had seen the video himself and also CPS saw video.  Patient stated that she did not remember saying these things.  That she has panic attacks and cannot remember what she says.  Vistaril given patient who is laying in bed, trying to rest.  Patient contracts for safety, no plan to hurt self.  Safety maintained with 15 minute checks.

## 2018-03-10 NOTE — Plan of Care (Signed)
Nurse discussed anxiety, depression and coping skills with patient.  

## 2018-03-10 NOTE — Progress Notes (Signed)
The patient verbalized that she had a very bad and that involved one nightmare after another. She did not state a goal for tomorrow.
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
## 2018-03-10 NOTE — [facility] Group Notes (Signed)
[facility] Group Notes:  (Nursing/MHT/Case Management/Adjunct)  Date:  03/10/2018  Time:  1:30 pm  Type of Therapy:  Psychoeducational Skills  Participation Level:  Active  Participation Quality:  Appropriate  Affect:  Appropriate  Cognitive:  Appropriate  Insight:  Appropriate  Engagement in Group:  Engaged  Modes of Intervention:  Education  Summary of Progress/Problems:  Patient alert and involved in group.   [name] 03/10/2018, 8:00 PM

## 2018-03-10 NOTE — Progress Notes (Signed)
DAR NOTE: Pt present with bright affect and pleasant mood in the unit. Pt has been seating in the dayroom at the beginning of shift playing with the puzzles. Pt stated that her day went well, she is happy with her medication regimen. Took her trazodone at 10 pm then went to bed, pt has been sleep with no issues. Pt's safety ensured with 15 minute and environmental checks. Pt currently denies SI/HI and A/V hallucinations. Pt verbally agrees to seek staff if SI/HI or A/VH occurs and to consult with staff before acting on these thoughts. Will continue POC.

## 2018-03-11 MED ORDER — SERTRALINE HCL 25 MG PO TABS
25.0000 mg | ORAL_TABLET | Freq: Every day | ORAL | Status: DC
  Administered 2018-03-11 – 2018-03-12 (×2): 25 mg via ORAL
  Filled 2018-03-11 (×4): qty 1

## 2018-03-11 MED ORDER — LORAZEPAM 0.5 MG PO TABS
0.5000 mg | ORAL_TABLET | Freq: Four times a day (QID) | ORAL | Status: DC | PRN
  Administered 2018-03-11 – 2018-03-15 (×7): 0.5 mg via ORAL
  Filled 2018-03-11 (×7): qty 1

## 2018-03-11 NOTE — Progress Notes (Addendum)
[facility] MD Progress Note  03/11/2018 10:33 AM [name]  MRN:  [id number] Subjective: Patient reports feeling depressed, mainly regarding concerns about CPS involvement, fears her child may be removed from her, and that it will be difficult to get custody back.  States that she apparently had made some suicidal and threatening statements during a "blackout" which was taped by her family and shown to CPS.  Currently denies medication side effects.  Denies suicidal ideations.   Objective: I have discussed case with treatment team and have met with patient.  23 year old female, admitted under IVC generated by family.  History of bipolar disorder.  IVC report indicates patient had been expressing suicidal ideations and had run in and out of the street/traffic. Patient denies/minimizes above.  She states that her relationship with her parents/father is poor/tense, and states she feels their decision to IVC her was at least partially vindictive, but she does acknowledge depression, anxiety, some passive SI recently.  She states that she has been off psychiatric medications for several years, remembers a history of good response to Wellbutrin/Lamictal combination in the past.   Today patient presents depressed and ruminative, mainly regarding concerns that CPS is involved and may remove her child from the household.  As above, reports that her family submitted a CPS report and that she has been told they had video of her making suicidal and threatening statements during a "blackout". We have explored her report of "blackouts" further-she describes what may be dissociative episodes  occurring in the context of severe anxiety, tension, sometimes associated with panic symptoms.  States her memory of these episodes is limited.  Of note she has had no dissociative episodes or amnestic episodes on unit and presents fully alert, attentive, oriented x3.  Denies suicidal ideations. Denies any violent or homicidal  ideations and specifically also denies any violent or homicidal ideations towards her child.  States "I love him with all my heart". No disruptive or agitated behaviors on the unit.  Currently does not endorse medication side effects.  TSH within normal limits.   Principal Problem: Major depressive disorder, recurrent episode, severe (HCC) Diagnosis: Principal Problem:   Major depressive disorder, recurrent episode, severe (HCC) Active Problems:   Bipolar affective disorder, depressed, severe ([place])  Total Time spent with patient: 20 minutes  Past Psychiatric History: See admission H&P  Past Medical History:  Past Medical History:  Diagnosis Date  . Anxiety   . Depression   . Headache   . Keratoconus of left eye   . Mental disorder   . Obesity     Past Surgical History:  Procedure Laterality Date  . CORNEAL TRANSPLANT  2016   per pt report  . WISDOM TOOTH EXTRACTION  Oct 2012   all four   Family History:  Family History  Problem Relation Age of Onset  . Bipolar disorder Maternal Grandmother    Family Psychiatric  History: See admission H&P Social History:  Social History   Substance and Sexual Activity  Alcohol Use No     Social History   Substance and Sexual Activity  Drug Use No    Social History   Socioeconomic History  . Marital status: Single    Spouse name: Not on file  . Number of children: Not on file  . Years of education: Not on file  . Highest education level: Not on file  Occupational History  . Occupation: Ship broker    Comment: 11th grade at [employer]  .  Financial resource strain: Not on file  . Food insecurity:    Worry: Not on file    Inability: Not on file  . Transportation needs:    Medical: Not on file    Non-medical: Not on file  Tobacco Use  . Smoking status: Never Smoker  . Smokeless tobacco: Never Used  Substance and Sexual Activity  . Alcohol use: No  . Drug use: No  . Sexual activity: Yes   Lifestyle  . Physical activity:    Days per week: Not on file    Minutes per session: Not on file  . Stress: Not on file  Relationships  . Social connections:    Talks on phone: Not on file    Gets together: Not on file    Attends religious service: Not on file    Active member of club or organization: Not on file    Attends meetings of clubs or organizations: Not on file    Relationship status: Not on file  Other Topics Concern  . Not on file  Social History Narrative  . Not on file   Additional Social History:   Sleep: Fair  Appetite:  Improving  Current Medications: Current Facility-Administered Medications  Medication Dose Route Frequency Provider Last Rate Last Dose  . acetaminophen (TYLENOL) tablet 650 mg  650 mg Oral Q6H PRN Patrecia Pour, NP      . alum & mag hydroxide-simeth (MAALOX/MYLANTA) 200-200-20 MG/5ML suspension 30 mL  30 mL Oral Q4H PRN Patrecia Pour, NP      . buPROPion (WELLBUTRIN XL) 24 hr tablet 150 mg  150 mg Oral Daily Keisi Eckford, Myer Peer, MD   150 mg at 03/11/18 0810  . hydrOXYzine (ATARAX/VISTARIL) tablet 25 mg  25 mg Oral TID PRN Rozetta Nunnery, NP   25 mg at 03/10/18 1903  . lamoTRIgine (LAMICTAL) tablet 25 mg  25 mg Oral Daily Stormi Vandevelde, Myer Peer, MD   25 mg at 03/11/18 0809  . magnesium hydroxide (MILK OF MAGNESIA) suspension 30 mL  30 mL Oral Daily PRN Patrecia Pour, NP      . prednisoLONE acetate (PRED FORTE) 1 % ophthalmic suspension 1 drop  1 drop Right Eye Daily Patrecia Pour, NP   1 drop at 03/11/18 [phone]  . traZODone (DESYREL) tablet 25 mg  25 mg Oral QHS,MR X 1 Connye Burkitt, NP   25 mg at 03/10/18 2311    Lab Results:  Results for orders placed or performed during the hospital encounter of 03/08/18 (from the past 48 hour(s))  TSH     Status: None   Collection Time: 03/10/18  6:44 AM  Result Value Ref Range   TSH 1.788 0.350 - 4.500 uIU/mL    Comment: Performed by a 3rd Generation assay with a functional sensitivity of <=0.01  uIU/mL. Performed at [facility], [place] [address] [place], [place] [address]     Blood Alcohol level:  Lab Results  Component Value Date   ETH <10 03/08/2018   ETH <10 37/62/8315    Metabolic Disorder Labs: No results found for: HGBA1C, MPG Lab Results  Component Value Date   PROLACTIN 20.8 03/07/2011   No results found for: CHOL, TRIG, HDL, CHOLHDL, VLDL, LDLCALC  Physical Findings: AIMS: Facial and Oral Movements Muscles of Facial Expression: None, normal Lips and Perioral Area: None, normal Jaw: None, normal Tongue: None, normal,Extremity Movements Upper (arms, wrists, hands, fingers): None, normal Lower (legs, knees, ankles, toes): None, normal,  Trunk Movements Neck, shoulders, hips: None, normal, Overall Severity Severity of abnormal movements (highest score from questions above): None, normal Incapacitation due to abnormal movements: None, normal Patient's awareness of abnormal movements (rate only patient's report): No Awareness, Dental Status Current problems with teeth and/or dentures?: No Does patient usually wear dentures?: No  CIWA:  CIWA-Ar Total: 1 COWS:  COWS Total Score: 1  Musculoskeletal: Strength & Muscle Tone: within normal limits Gait & Station: normal Patient leans: N/A  Psychiatric Specialty Exam: Physical Exam  Nursing note and vitals reviewed. Constitutional: She is oriented to person, place, and time. She appears well-developed and well-nourished.  Cardiovascular: Normal rate.  Respiratory: Effort normal.  Neurological: She is alert and oriented to person, place, and time.    Review of Systems  Constitutional: Negative.   Psychiatric/Behavioral: Positive for depression (improving) and substance abuse (hx ETOH). Negative for hallucinations, memory loss and suicidal ideas. The patient is not nervous/anxious and does not have insomnia.   No chest pain, no shortness of breath, no nausea or vomiting  Blood pressure 113/77,  pulse 76, temperature 98.6 F (37 C), temperature source Oral, resp. rate 18, height '5\' 5"'$  (1.651 m), weight 84.4 kg, SpO2 100 %, unknown if currently breastfeeding.Body mass index is 30.95 kg/m.  General Appearance: Well Groomed  Eye Contact:  Fair-improves during session  Speech:  Normal Rate  Volume:  Normal  Mood:  Describes some improvement in her mood compared to admission, remains depressed  Affect:  Constricted, ruminative, anxious, tends to improve with reassurance/during session, does smile at times appropriately  Thought Process:  Linear and Descriptions of Associations: Intact  Orientation:  Other:  Fully alert and attentive  Thought Content:  No hallucinations, no delusions  Suicidal Thoughts:  No-denies any suicidal or self-injurious ideations, denies any homicidal or violent ideations, also specifically denies any violent or homicidal ideations towards her child  Homicidal Thoughts:  No  Memory:  Recent and remote grossly intact  Judgement:  Fair/improving  Insight:  Fair  Psychomotor Activity:  Normal  Concentration:  Concentration: Good and Attention Span: Good  Recall:  Good  Fund of Knowledge:  Fair  Language:  Good  Akathisia:  No  Handed:  Right  AIMS (if indicated):     Assets:  Communication Skills Desire for Improvement Physical Health Resilience Social Support  ADL's:  Intact  Cognition:  WNL  Sleep:  Number of Hours: 5.75   Assessment: 23 year old female, admitted under IVC generated by family.  History of bipolar disorder.  IVC report indicates patient had been expressing suicidal ideations and had run in and out of the street/traffic. Patient denies/minimizes above.  She states that her relationship with her parents/father is poor/tense, and states she feels their decision to IVC her was at least partially vindictive, but she does acknowledge depression, anxiety, some passive SI recently.  She states that she has been off psychiatric medications for  several years, remembers a history of good response to Wellbutrin/Lamictal combination in the past.  Currently patient reports some improvement compared to how she felt prior to admission, but describes lingering depression and anxiety currently focused on concern that CPS may take her child/she may lose custody of her child.  She has described episodes of "blackouts" in the past, described as brief dissociative symptoms occurring during periods of severe stress or anxiety.  She has had no amnestic or dissociative episodes on unit.  Denies suicidal ideations and remains future oriented.  Tolerating medications well-reports history of good response  to Wellbutrin/Lamictal in the past and is tolerating meds well thus far.  We discussed adding an SSRI to current regimen to further address depression and anxiety symptoms.  Agrees to Zoloft, will start at 25 mg initially and titrate gradually if well tolerated. Will add low dose Ativan PRNs for significant anxiety, as needed . Treatment Plan Summary: Daily contact with patient to assess and evaluate symptoms and progress in treatment and Medication management  Treatment plan reviewed as below today 2/6 Encourage group and milieu participation to work on coping skills and symptom reduction. Continue Wellbutrin XL 150 mg PO daily for depression Continue Lamictal 25 mg PO daily for mood /depression Start Zoloft 25 mg p.o. daily for depression/anxiety Start Ativan 0.5 mgrs Q 6 hours PRN for anxiety as needed  Treatment team working on disposition planning options.    Jenne Campus, MD 03/11/2018, 10:33 AM   Patient ID: [name], female   DOB: [birth year], 23 y.o.   MRN: [id number]

## 2018-03-11 NOTE — Progress Notes (Addendum)
Pt attended wrap-up group.Pt appears depressed/sad/anxious/tearful in affect and mood. Pt denies SI/HI/AVH/Pain at this time. Pt states she feels lonely and that she has no support. Pt states she gets "blackouts" when she has a panic attack. Pt states she is going to stay with her ex-boyfriend temporary after d/c. Suport offered.Will continue with POC.

## 2018-03-11 NOTE — Progress Notes (Signed)
D:Pt has been anxious and crying on the unit. She reports that her boyfriend told her not to call him that he was working on himself and that his child comes first. Pt says that he was the only support person that she has. She c/o chest pain today that she describes as heart pain with anxiety.  A:Supported pt to discuss feelings. Offered encouragement and 15 minute checks. R:Pt denies si and hi. Safety maintained on the unit.

## 2018-03-11 NOTE — [facility] Suicide Risk Assessment (Signed)
[facility] INPATIENT:  Family/Significant Other Suicide Prevention Education  Suicide Prevention Education:  Contact Attempts:  boyfriend, Marvia Pickles [phone]) has been identified by the patient as the family member/significant other with whom the patient will be residing, and identified as the person(s) who will aid the patient in the event of a mental health crisis.  With written consent from the patient, two attempts were made to provide suicide prevention education, prior to and/or following the patient's discharge.  We were unsuccessful in providing suicide prevention education.  A suicide education pamphlet was given to the patient to share with family/significant other.  Date and time of first attempt:03/11/2018 / 3:14pm   [name] 03/11/2018, 3:14 PM

## 2018-03-11 NOTE — [facility] Group Notes (Signed)
[facility] Mental [facility] Group Therapy      03/11/2018 3:18 PM  Type of Therapy: Mental Health Association Presentation  Participation Level: Active  Participation Quality: Attentive  Affect: Appropriate  Cognitive: Oriented  Insight: Developing/Improving  Engagement in Therapy: Engaged  Modes of Intervention: Discussion, Education and Socialization  Summary of Progress/Problems: Mental Health Association (MHA) Speaker came to talk about his personal journey with mental health. The pt processed ways by which to relate to the speaker. MHA speaker provided handouts and educational information pertaining to groups and services offered by the [facility]. Pt was engaged in speaker's presentation and was receptive to resources provided.    Alcario Drought Clinical Social Worker

## 2018-03-12 ENCOUNTER — Encounter (HOSPITAL_COMMUNITY): Payer: Self-pay | Admitting: Behavioral Health

## 2018-03-12 MED ORDER — SERTRALINE HCL 50 MG PO TABS
50.0000 mg | ORAL_TABLET | Freq: Every day | ORAL | Status: DC
  Administered 2018-03-13 – 2018-03-15 (×3): 50 mg via ORAL
  Filled 2018-03-12 (×6): qty 1

## 2018-03-12 MED ORDER — OSELTAMIVIR PHOSPHATE 75 MG PO CAPS
75.0000 mg | ORAL_CAPSULE | Freq: Every day | ORAL | Status: DC
  Administered 2018-03-12 – 2018-03-15 (×4): 75 mg via ORAL
  Filled 2018-03-12 (×6): qty 1

## 2018-03-12 MED ORDER — TRAZODONE 25 MG HALF TABLET
25.0000 mg | ORAL_TABLET | Freq: Every day | ORAL | Status: DC
  Administered 2018-03-12 – 2018-03-13 (×2): 25 mg via ORAL
  Filled 2018-03-12 (×4): qty 1

## 2018-03-12 NOTE — Progress Notes (Signed)
D Pt is observed OOB UAL on the 400 hall today. She wears hospital-issued patient srubs. She endorses a flat, depressed affect. She is seen standing in the 400 hall...as if she doesn't know where to go or what to do. When this writer asks her if she needs help or has a question, she acts embarassed, shakes her head from side to side "no" and says no bother.        A She completed her daily assessment and on it she wrote she denied SI today and she rated her depression, hopelessness and anxiety " 6/2/7", respectively. She looks lost- both physically and emotionally.     R Safety is in place. Support offered. RN to cont to try to establish and foster therapetuic relationship.

## 2018-03-12 NOTE — Progress Notes (Signed)
Progress note: Patient has been identified as potentially exposed to other patient with influenza. We will provide prophylaxis with tamiflu. 

## 2018-03-12 NOTE — Progress Notes (Addendum)
Pt attended wrap-up group. Pt appears depressed/sad/anxious/tearful in affect and mood. Pt endorses passive SI but verbal contracts for safety. Denies HI/AVH/Pain at this time. Pt states she is tempted to call her ex-boyfriend. Pt states she feels lonely and need someone to talk to.Suport offered. Pt was given diversion activities to work on. Pt c/o of poor sleep. Provider on call notified;See MAR.Will continue with POC.

## 2018-03-12 NOTE — Progress Notes (Signed)
Recreation Therapy Notes  Date:  2.7..20 Time: 0930 Location: 300 Hall Dayroom  Group Topic: Stress Management  Goal Area(s) Addresses:  Patient will identify positive stress management techniques. Patient will identify benefits of using stress management post d/c.  Behavioral Response:  Engaged  Intervention:  Stress Management  Activity :   Meditation.  LRT introduced the stress management technique of meditation.  LRT played a meditation that focused on the idea of "pure possibility" to bring into the day.  Patients were to listen to the meditation and follow along as it played to engage.  Education:  Stress Management, Discharge Planning.   Education Outcome: Acknowledges Education  Clinical Observations/Feedback:  Pt attended and participated in group session.      Hong Moring, LRT/CTRS         Arhaan Chesnut A 03/12/2018 11:27 AM 

## 2018-03-12 NOTE — Progress Notes (Signed)
[facility]  03/12/2018 11:52 AM Selena CatalanSamantha C Seat  MRN:  [id number] Subjective: Patient reports slightly improved mood  Today, but continues to focus on concerns regarding CPS involvement and possibly not having a place to go following discharge.   She endorses ongoing anxiety regarding above stressors. Denies sucidal thoughts at this time.  Does not endorse medication side effects .    Objective: Rounding completed ,case discussed with treatment team and chart reviewed.  23 year old female, admitted under IVC generated by family.  History of bipolar disorder.  IVC report indicates patient had been expressing suicidal ideations and had run in and out of the street/traffic. Patient denies/minimizes above.  She states that her relationship with her parents/father is poor/tense, and states she feels their decision to IVC her was at least partially vindictive, but she does acknowledge depression, anxiety, some passive SI recently.  She states that she has been off psychiatric medications for several years, remembers a history of good response to Wellbutrin/Lamictal combination in the past.   Today on evaluation patient presents with a partially improved mood, states she is feeling " a little better" than yesterday, but does remain anxious, constricted . She reports her mood is slowly improving. She continues to ruminate about issues surrounding current CPS case.  Per nursing, patient called her child's father multiple times yesterday via phone and phone conversations appeared to be making her more upset, which she acknowledged, reporting feeling she has limited support from her family and her child's father, and that it was not clear when CPS would return her child or if she could return to live with him after discharge. We reviewed this and she has agreed to decrease phone calls and states " I am going to focus on me for now". We did discuss having a family session with patient and her child's  father prior to her discharge and she verbally agreed. CSW will call and set up this session.  Patient has had no dissociative episodes or amnestic episodes/blackouts on the unit. She states these usually occur when under severe stress/ experiencing panic symptoms,  and states she has fragmentary memories of these episodes.   At this time denies suicidal ideations, homicidal ideations. Also specifically denies violent thoughts or homicidal ideation towards her child.  She is participating in unit milieu including therapeutic group sessions which she states is helping her deal with her stressors.  Currently does not endorse medication intolerance or  side effects.   Principal Problem: Major depressive disorder, recurrent episode, severe (HCC) Diagnosis: Principal Problem:   Major depressive disorder, recurrent episode, severe (HCC) Active Problems:   Bipolar affective disorder, depressed, severe (HCC)  Total Time spent with patient: 20 minutes  Past Psychiatric History: See admission H&P  Past Medical History:  Past Medical History:  Diagnosis Date  . Anxiety   . Depression   . Headache   . Keratoconus of left eye   . Mental disorder   . Obesity     Past Surgical History:  Procedure Laterality Date  . CORNEAL TRANSPLANT  2016   per pt report  . WISDOM TOOTH EXTRACTION  Oct 2012   all four   Family History:  Family History  Problem Relation Age of Onset  . Bipolar disorder Maternal Grandmother    Family Psychiatric  History: See admission H&P Social History:  Social History   Substance and Sexual Activity  Alcohol Use No     Social History   Substance and Sexual Activity  Drug Use No    Social History   Socioeconomic History  . Marital status: Single    Spouse name: Not on file  . Number of children: Not on file  . Years of education: Not on file  . Highest education level: Not on file  Occupational History  . Occupation: Consulting civil engineerstudent    Comment: 11th grade at  [employer]  Social Needs  . Financial resource strain: Not on file  . Food insecurity:    Worry: Not on file    Inability: Not on file  . Transportation needs:    Medical: Not on file    Non-medical: Not on file  Tobacco Use  . Smoking status: Never Smoker  . Smokeless tobacco: Never Used  Substance and Sexual Activity  . Alcohol use: No  . Drug use: No  . Sexual activity: Yes  Lifestyle  . Physical activity:    Days per week: Not on file    Minutes per session: Not on file  . Stress: Not on file  Relationships  . Social connections:    Talks on phone: Not on file    Gets together: Not on file    Attends religious service: Not on file    Active member of club or organization: Not on file    Attends meetings of clubs or organizations: Not on file    Relationship status: Not on file  Other Topics Concern  . Not on file  Social History Narrative  . Not on file   Additional Social History:   Sleep: improving   Appetite:  Improving  Current Medications: Current Facility-Administered Medications  Medication Dose Route Frequency Provider Last Rate Last Dose  . acetaminophen (TYLENOL) tablet 650 mg  650 mg Oral Q6H PRN Charm RingsLord, Jamison Y, NP   650 mg at 03/11/18 1145  . alum & mag hydroxide-simeth (MAALOX/MYLANTA) 200-200-20 MG/5ML suspension 30 mL  30 mL Oral Q4H PRN Charm RingsLord, Jamison Y, NP      . buPROPion (WELLBUTRIN XL) 24 hr tablet 150 mg  150 mg Oral Daily , Rockey SituFernando A, MD   150 mg at 03/12/18 0810  . lamoTRIgine (LAMICTAL) tablet 25 mg  25 mg Oral Daily , Rockey SituFernando A, MD   25 mg at 03/12/18 0810  . LORazepam (ATIVAN) tablet 0.5 mg  0.5 mg Oral Q6H PRN , Rockey SituFernando A, MD   0.5 mg at 03/11/18 2219  . magnesium hydroxide (MILK OF MAGNESIA) suspension 30 mL  30 mL Oral Daily PRN Charm RingsLord, Jamison Y, NP      . prednisoLONE acetate (PRED FORTE) 1 % ophthalmic suspension 1 drop  1 drop Right Eye Daily Charm RingsLord, Jamison Y, NP   1 drop at 03/12/18 0809  . sertraline  (ZOLOFT) tablet 25 mg  25 mg Oral Daily , Rockey SituFernando A, MD   25 mg at 03/12/18 13080810    Lab Results:  No results found for this or any previous visit (from the past 48 hour(s)).  Blood Alcohol level:  Lab Results  Component Value Date   ETH <10 03/08/2018   ETH <10 06/20/2017    Metabolic Disorder Labs: No results found for: HGBA1C, MPG Lab Results  Component Value Date   PROLACTIN 20.8 03/07/2011   No results found for: CHOL, TRIG, HDL, CHOLHDL, VLDL, LDLCALC  Physical Findings: AIMS: Facial and Oral Movements Muscles of Facial Expression: None, normal Lips and Perioral Area: None, normal Jaw: None, normal Tongue: None, normal,Extremity Movements Upper (arms, wrists, hands, fingers): None,  normal Lower (legs, knees, ankles, toes): None, normal, Trunk Movements Neck, shoulders, hips: None, normal, Overall Severity Severity of abnormal movements (highest score from questions above): None, normal Incapacitation due to abnormal movements: None, normal Patient's awareness of abnormal movements (rate only patient's report): No Awareness, Dental Status Current problems with teeth and/or dentures?: No Does patient usually wear dentures?: No  CIWA:  CIWA-Ar Total: 1 COWS:  COWS Total Score: 1  Musculoskeletal: Strength & Muscle Tone: within normal limits Gait & Station: normal Patient leans: N/A  Psychiatric Specialty Exam: Physical Exam  Nursing note and vitals reviewed. Constitutional: She is oriented to person, place, and time. She appears well-developed and well-nourished.  Cardiovascular: Normal rate.  Respiratory: Effort normal.  Neurological: She is alert and oriented to person, place, and time.    Review of Systems  Constitutional: Negative.   Psychiatric/Behavioral: Positive for depression (improving) and substance abuse (hx ETOH). Negative for hallucinations, memory loss and suicidal ideas. The patient is not nervous/anxious and does not have insomnia.   No  chest pain, no shortness of breath, no nausea or vomiting  Blood pressure 118/86, pulse 94, temperature 98.3 F (36.8 C), temperature source Oral, resp. rate 20, height 5\' 5"  (1.651 m), weight 84.4 kg, SpO2 100 %, unknown if currently breastfeeding.Body mass index is 30.95 kg/m.  General Appearance: improving grooming   Eye Contact:  Good  Speech:  Normal Rate  Volume:  Normal  Mood:  partial improvement , less depressed  Affect:  still vaguely constricted, but today smiles at times appropriately, not tearful today  Thought Process:  Linear and Descriptions of Associations: Intact  Orientation:  Other:  Fully alert and attentive  Thought Content:  No hallucinations, no delusions  Suicidal Thoughts:  No-denies any SI or self injurious ideations at this time.  Homicidal Thoughts:  No also, denies any homicidal or violent thoughts towards her child.   Memory:  Recent and remote grossly intact  Judgement:  Fair/improving  Insight:  Fair  Psychomotor Activity:  Normal  Concentration:  Concentration: Good and Attention Span: Good  Recall:  Good  Fund of Knowledge:  Fair  Language:  Good  Akathisia:  No  Handed:  Right  AIMS (if indicated):     Assets:  Communication Skills Desire for Improvement Physical Health Resilience Social Support  ADL's:  Intact  Cognition:  WNL  Sleep:  Number of Hours: 6.25   Assessment: 23 year old female, admitted under IVC generated by family.  History of bipolar disorder.  IVC report indicates patient had been expressing suicidal ideations and had run in and out of the street/traffic. Patient denies/minimizes above.  She states that her relationship with her parents/father is poor/tense, and states she feels their decision to IVC her was at least partially vindictive, but she does acknowledge depression, anxiety, some passive SI recently.  She states that she has been off psychiatric medications for several years, remembers a history of good response to  Wellbutrin/Lamictal combination in the past.  Today patient presents with partially improved mood and range of affect . She remains anxious, depressed, ruminative about stressors as above and CPS involvement, but states she is feeling better today. She is also gaining insight regarding increased anxiety caused by making multiple phone calls to child's father for information /reassurance, and states she plans to focus more on milieu and working on coping skills. She describes " blackouts" prior to admission, described as possible dissociative episodes triggered by severe anxiety/stress, but has had no such episodes on  unit, and presents alert, attentive, oriented x 3.   Treatment Plan Summary: Reviewed current treatment plan, as below, today 2/7   Daily contact with patient to assess and evaluate symptoms and progress in treatment and Medication management  Encourage group and milieu participation to work on coping skills and symptom reduction. Continue Wellbutrin XL 150 mg PO daily for depression Continue Lamictal 25 mg PO daily for mood /depression Increase  Zoloft to 50  mg PO . daily for depression/anxiety ContinueAtivan 0.5 mgrs Q 6 hours PRN for anxiety as needed  Treatment team working on disposition planning options.   Sallyanne Havers, MD  Patient ID: [name], female   DOB: [birth year], 23 y.o.   MRN: [id number]

## 2018-03-13 NOTE — Progress Notes (Signed)
D. Pt presents with a depressed affect/anxious/depressed mood- states she would "like to discuss discharge plan with the doctor". Pt observed interacting appropriately in the milieu, and attending group. Pt currently denies SI/HI and AV hallucinations. A. Labs and vitals monitored. Pt given and educated on medications. Pt supported emotionally and encouraged to express concerns and ask questions.   R. Pt remains safe with 15 minute checks. Will continue POC.

## 2018-03-13 NOTE — [facility] Group Notes (Signed)
[facility] LCSW Group Therapy Note  Date/Time:    03/13/2018 10:00-11:00AM  Type of Therapy and Topic:  Group Therapy:  Healthy vs Unhealthy Coping Skills  Participation Level:  Active   Description of Group:  The focus of this group was to determine what unhealthy coping techniques typically are used by group members and what healthy coping techniques would be helpful in coping with various problems. Patients were guided in becoming aware of the differences between healthy and unhealthy coping techniques.  Patients were asked to identify 1-2 healthy coping skills they would like to learn to use more effectively, and many mentioned meditation, breathing, and relaxation.  These were explained, samples demonstrated, and resources shared for how to learn more at discharge.     Therapeutic Goals 1. Patients learned that coping is what human beings do all day long to deal with various situations in their lives 2. Patients defined and discussed healthy vs unhealthy coping techniques 3. Patients identified their preferred coping techniques and identified whether these were healthy or unhealthy 4. Patients determined 1-2 healthy coping skills they would like to become more familiar with and use more often, and practiced a few meditations 5. Patients provided support and ideas to each other  Summary of Patient Progress: During group, patient expressed that she has numerous unhealthy coping techniques of which she is aware and she described these well.  She appeared to be very open to improving her coping technique repertoire.   Therapeutic Modalities Cognitive Behavioral Therapy Motivational Interviewing   Ambrose Mantle, LCSW 03/13/2018, 11:23 AM

## 2018-03-13 NOTE — B[facility] Group Notes (Signed)
Adult Psychoeducational Group Note  Date:  03/13/2018 Time:  9:15 PM  Group Topic/Focus:  Wrap-Up Group:   The focus of this group is to help patients review their daily goal of treatment and discuss progress on daily workbooks.  Participation Level:  Active  Participation Quality:  Appropriate and Attentive  Affect:  Appropriate  Cognitive:  Alert and Appropriate  Insight: Appropriate and Good  Engagement in Group:  Engaged  Modes of Intervention:  Discussion and Education  Additional Comments:  Pt attended and participated in wrap up group this evening. Pt day was "up and down", due to them having physical pain in their stomach because of their anxiety and paranoia. Pt completed their goal to stay busy and take their mind off their anxiety.   Chrisandra Netters 03/13/2018, 9:15 PM
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
## 2018-03-14 MED ORDER — BUPROPION HCL ER (XL) 300 MG PO TB24
300.0000 mg | ORAL_TABLET | Freq: Every day | ORAL | Status: DC
  Administered 2018-03-15: 300 mg via ORAL
  Filled 2018-03-14 (×4): qty 1

## 2018-03-14 MED ORDER — HYDROXYZINE HCL 50 MG PO TABS
50.0000 mg | ORAL_TABLET | Freq: Three times a day (TID) | ORAL | Status: DC | PRN
  Administered 2018-03-14 – 2018-03-15 (×2): 50 mg via ORAL
  Filled 2018-03-14 (×2): qty 1

## 2018-03-14 MED ORDER — MELATONIN 5 MG PO TABS
5.0000 mg | ORAL_TABLET | Freq: Every day | ORAL | Status: DC
  Administered 2018-03-14: 5 mg via ORAL
  Filled 2018-03-14 (×4): qty 1

## 2018-03-14 NOTE — Plan of Care (Addendum)
D: Patient observed in the dayroom on approach. Patient is alert, oriented, and cooperative. Denies SI, HI, AVH, and verbally contracts for safety. Patient reports his/her appetite as fair but improved. Patient reports her heart and her stomach hurt after being anxious the past few days and indicates it is a physical pain rated 4/10 but declines tylenol. Reports anxiety is not as bad. Requests medications for sleep.    A: Medications administered per MD order. Support provided. Patient educated on safety on the unit and medications. Routine safety checks every 15 minutes. Patient stated understanding to tell nurse about any new physical symptoms. Patient understands to tell staff of any needs.     R: No adverse drug reactions noted. Patient verbally contracts for safety. Patient remains safe at this time and will continue to monitor.   Problem: Education: Goal: Mental status will improve Outcome: Progressing   Problem: Safety: Goal: Periods of time without injury will increase Outcome: Progressing   Patient denies SI, HI, AVH, and contracts for safety. Patient reports feeling less anxious today. Patient remains safe and will continue to monitor.
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
## 2018-03-14 NOTE — Plan of Care (Signed)
  Problem: Activity: Goal: Interest or engagement in activities will improve Outcome: Progressing   D: Pt alert and oriented on the unit. Pt engaging with RN staff and other pts. Pt denies SI/HI, A/VH. Pt's affect was flat and mood depressed. Pt was in the day room for most of the day putting puzzles together and watching television with other pts. Pt also participated during unit groups and activities. Pt is pleasant and cooperative. A: Education, support and encouragement provided, q15 minute safety checks remain in effect. Medications administered per MD orders. R: No reactions/side effects to medicine noted. Pt denies any concerns at this time, and verbally contracts for safety. Pt ambulating on the unit with no issues. Pt remains safe on and off the unit.
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
## 2018-03-14 NOTE — Progress Notes (Signed)
Adult Psychoeducational Group Note  Date:  03/14/2018 Time:  8:51 PM  Group Topic/Focus:  Wrap-Up Group:   The focus of this group is to help patients review their daily goal of treatment and discuss progress on daily workbooks.  Participation Level:  Active  Participation Quality:  Appropriate  Affect:  Appropriate  Cognitive:  Appropriate  Insight: Appropriate  Engagement in Group:  Engaged  Modes of Intervention:  Discussion  Additional Comments:  Patient attended group but did not participate.  Flavia Bruss W Detrell Umscheid 03/14/2018, 8:51 PM

## 2018-03-14 NOTE — [facility] Group Notes (Signed)
[facility] LCSW Group Therapy Note  03/14/2018   10:00-11:00AM  Type of Therapy and Topic:  Group Therapy:  Unhealthy versus Healthy Supports, Which Am I?  Participation Level:  Active   Description of Group:  Patients in this group were introduced to the concept that additional supports including self-support are an essential part of recovery.  Initially a discussion was held about the differences between healthy versus unhealthy supports.  Patients were asked to share what unhealthy supports in their lives need to be addressed, as well as what additional healthy supports could be added for greater help in reaching their goals.   A song entitled "My Own Hero" was played and a group discussion ensued in which patients stated they could relate to the song and it inspired them to realize they have be willing to help themselves in order to succeed, because other people cannot achieve sobriety or stability for them.  We discussed adding a variety of healthy supports to address the various needs in patient lives, including becoming more self-supportive.  Therapeutic Goals: 1)  Highlight the differences between healthy and unhealthy supports 2)  Suggest the importance of being a part of one's own support system 2)  Discuss reasons people in one's life may eventually be unable to be continually supportive  3)  Identify the patient's current support system and   4)  elicit commitments to add healthy supports and to become more conscious of being self-supportive   Summary of Patient Progress:  The patient expressed that the unhealthy support which needs to be addressed includes her parents who although they involuntarily committed her, do not check on her and do not understand mental illness or seem to want to understand.  Healthy supports which for increased stability and happiness include  her baby's father who has his own mental illness and therefore does understand her, wants her to focus on  herself.  Therapeutic Modalities:   Motivational Interviewing Activity  Lynnell Chad

## 2018-03-15 MED ORDER — MELATONIN 5 MG PO TABS: 5.0000 mg | ORAL_TABLET | Freq: Every day | ORAL | 0 refills | Status: DC

## 2018-03-15 MED ORDER — OSELTAMIVIR PHOSPHATE 75 MG PO CAPS: 75.0000 mg | ORAL_CAPSULE | Freq: Every day | ORAL | 0 refills | Status: DC

## 2018-03-15 MED ORDER — HYDROXYZINE HCL 50 MG PO TABS: 50.0000 mg | ORAL_TABLET | Freq: Three times a day (TID) | ORAL | 0 refills | Status: AC | PRN

## 2018-03-15 MED ORDER — LORAZEPAM 0.5 MG PO TABS: 0.5000 mg | ORAL_TABLET | Freq: Four times a day (QID) | ORAL | 0 refills | Status: DC | PRN

## 2018-03-15 MED ORDER — LAMOTRIGINE 25 MG PO TABS: 25.0000 mg | ORAL_TABLET | Freq: Every day | ORAL | 0 refills | Status: DC

## 2018-03-15 MED ORDER — SERTRALINE HCL 50 MG PO TABS: 50.0000 mg | ORAL_TABLET | Freq: Every day | ORAL | 0 refills | Status: AC

## 2018-03-15 MED ORDER — BUPROPION HCL ER (XL) 300 MG PO TB24: 300.0000 mg | ORAL_TABLET | Freq: Every day | ORAL | 0 refills | Status: AC

## 2018-03-15 NOTE — [facility] Suicide Risk Assessment (Signed)
[facility] INPATIENT:  Family/Significant Other Suicide Prevention Education  Suicide Prevention Education:  Contact Attempts: boyfriend, Marvia Pickles [phone]) has been identified by the patient as the family member/significant other with whom the patient will be residing, and identified as the person(s) who will aid the patient in the event of a mental health crisis.  With written consent from the patient, two attempts were made to provide suicide prevention education, prior to and/or following the patient's discharge.  We were unsuccessful in providing suicide prevention education.  A suicide education pamphlet was given to the patient to share with family/significant other.   Date and time of second attempt:03/15/2018 / 3:55pm   [name] 03/15/2018, 3:55 PM

## 2018-03-15 NOTE — Progress Notes (Signed)
D:  Patient denied SI and HI, contracts for safety.  Denied A/V hallucinations.  Safety maintaiined with 15 minute checks. A:  Medications administered per MD order.  Emotional support and encouragement given patient. R:  Safety maintained with 15 minute checks.

## 2018-03-15 NOTE — Plan of Care (Signed)
Nurse discussed anxiety, depression and coping skills with patient.  

## 2018-03-15 NOTE — [facility] Suicide Risk Assessment (Addendum)
[facility]   Principal Problem: Major depressive disorder, recurrent episode, severe (HCC) Discharge Diagnoses: Principal Problem:   Major depressive disorder, recurrent episode, severe (HCC) Active Problems:   Bipolar affective disorder, depressed, severe (HCC)   Total Time spent with patient: 30 minutes  Musculoskeletal: Strength & Muscle Tone: within normal limits Gait & Station: normal Patient leans: N/A  Psychiatric Specialty Exam: ROS denies chest pain, no shortness of breath, no vomiting   Blood pressure 129/89, pulse 95, temperature 97.7 F (36.5 C), temperature source Oral, resp. rate 16, height 5\' 5"  (1.651 m), weight 84.4 kg, SpO2 100 %, unknown if currently breastfeeding.Body mass index is 30.95 kg/m.  General Appearance: improving grooming   Eye Contact::  Good  Speech:  Normal Rate409  Volume:  Normal  Mood:  improving, states she feels better   Affect:  becoming more reactive, brighter   Thought Process:  Linear and Descriptions of Associations: Intact  Orientation:  Full (Time, Place, and Person)  Thought Content:  no hallucinations, no delusions, not internally preoccupied   Suicidal Thoughts:  No denies suicidal ideations, denies self injurious ideations, denies homicidal or violent ideations, specifically also denies any violent or homicidal ideations towards her child   Homicidal Thoughts:  No  Memory:  recent and remote grossly intact   Judgement:  Other:  improving  Insight:  Fair/ improving   Psychomotor Activity:  Normal  Concentration:  Good  Recall:  Good  Fund of Knowledge:Good  Language: Good  Akathisia:  Negative  Handed:  Right  AIMS (if indicated):     Assets:  Communication Skills Desire for Improvement Resilience  Sleep:  Number of Hours: 6.75  Cognition: WNL  ADL's:  Intact   Mental Status Per Nursing Assessment::   On Admission:  NA  Demographic Factors:  23 year old female , lives with BF, has a 325  month old child, currently unemployed   Loss Factors: Limited support network, relationship stressors  Historical Factors: History of mood disorder, has been diagnosed with Bipolar Disorder in the past, history of prior admission to psychiatric unit as adolescent, history of anxiety, panic attacks  Risk Reduction Factors:   Responsible for children under 25 years of age, Sense of responsibility to family and Positive coping skills or problem solving skills  Continued Clinical Symptoms:  Patient presents alert, attentive, with improving mood, a full range of affect.  No thought disorder.  Denies any suicidal or self-injurious ideations.  No homicidal or violent ideations.  Specifically denies any violent or homicidal ideations towards her child.  No hallucinations, no delusions, oriented x3. Currently does not endorse medication side effects.  Behavior on unit is calm and in good control.  Of note, patient has had no "blackouts" or dissociative episodes on unit.  Patient reports relationship with BF has been a stressor, as he has expressed their  relationship has changed and now more focused on co-parenting . She states he does remain supportive of her , and that she plans to continue focusing  on herself and improvement more than on relationship issues . Patient is aware that CPS is involved, and states her child is currently with child's grandparents.  She states she is aware that " it will take some time for me to get custody back", but expresses optimism that she will eventually regain custody by continuing outpatient treatment/management as requested by CPS.  She has provided consent to contact CPS and our CSW has been in contact-report from CSW  is that they have no current concerns regarding patient returning to live with her boyfriend/child's father  Cognitive Features That Contribute To Risk:  No gross cognitive deficits noted upon discharge. Is alert , attentive, and oriented x 3    Suicide Risk:  Mild:  Suicidal ideation of limited frequency, intensity, duration, and specificity.  There are no identifiable plans, no associated intent, mild dysphoria and related symptoms, good self-control (both objective and subjective assessment), few other risk factors, and identifiable protective factors, including available and accessible social support.  Follow-up Information    [facility] Follow up on 03/18/2018.   Why:  Hospital follow up appointment is 2/13 at 8:00a. Please bring your photo ID, proof of insurance, SSN, current medications, and discharge paperwork from this hospitalization.  Contact information: [address] [place] [address] [phone]           Plan Of Care/Follow-up recommendations:  Activity:  as tolerated Diet:  regular Tests:  NA Other:  See below  Patient is reporting feeling better and readiness for discharge, no ongoing grounds for involuntary commitment at this time, leaving unit in good spirits, plans to return home.  Plans to follow up as above .  Craige Cotta, MD 03/15/2018, 2:05 PM

## 2018-03-15 NOTE — Progress Notes (Signed)
  [facility] Adult Case Management Discharge Plan :  Will you be returning to the same living situation after discharge:  No. At discharge, do you have transportation home?: Yes,  bus passes Do you have the ability to pay for your medications: No.  Release of information consent forms completed and in the chart;  Patient's signature needed at discharge.  Patient to Follow up at: Follow-up Information    [facility] Follow up on 03/18/2018.   Why:  Hospital follow up appointment is 2/13 at 8:00a. Please bring your photo ID, proof of insurance, SSN, current medications, and discharge paperwork from this hospitalization.  Contact information: [address] [place] [address] [phone]           Next level of care provider has access to [facility] Link:yes  Safety Planning and Suicide Prevention discussed: Yes,  with the patient   Have you used any form of tobacco in the last 30 days? (Cigarettes, Smokeless Tobacco, Cigars, and/or Pipes): No  Has patient been referred to the Quitline?: N/A patient is not a smoker  Patient has been referred for addiction treatment: N/A  Maeola Sarah, LCSWA 03/15/2018, 3:54 PM

## 2018-03-15 NOTE — Plan of Care (Signed)
D: Patient in hallway very tearful on approach. She had a conversation with her son's father where he revealed "he is supportive of my mental health but isn't attracted to me anymore". Patient very distraught and required a lot of time with little success in redirecting. Patient is alert and oriented. Denies SI, HI, AVH, and verbally contracts for safety. Patient reports her heart hurts and she has been betrayed and she is alone in the world.   A: Medications administered per MD order. Support provided. Patient educated on safety on the unit and medications. Routine safety checks every 15 minutes. Patient stated understanding to tell nurse about any new physical symptoms. Patient understands to tell staff of any needs.     R: No adverse drug reactions noted. Patient verbally contracts for safety. Patient remains safe at this time and will continue to monitor.   Problem: Education: Goal: Knowledge of [place] Education information/materials will improve Outcome: Progressing   Problem: Safety: Goal: Periods of time without injury will increase Outcome: Progressing   Patient oriented to the unit. Patient remains safe and will continue to monitor.

## 2018-03-15 NOTE — Progress Notes (Signed)
CSW spoke with the patient's  DSS-CPS caseworker, Leigh Aurora [phone]) to discuss the patient's discharge plan. Per Misty Stanley, she cannot disclose any information regarding the patient's open CPS case, however she stated that she would contact the patient's ex-boyfriend to follow up.   Misty Stanley did not report any concerns or questions to this clinician, regarding the patient discharging home with her ex-boyfriend at this time.   According to the patient, she plans to discharge home with the boyfriend for a short time until he can assist her with alternative living arrangements while their CPS case is undergoing investigation. The patient's child is currently in the custody of her boyfriend's parents.   Per Dr. Jama Flavors, the patient is discharging this afternoon (03/15/2018). The patient will either be picked up by her boyfriend or will take bus passes to his home. Patient has a hospital follow up appointment at [facility] for outpatient medication management and therapy services.   CSW will continue to follow.     Baldo Daub, MSW, LCSWA Clinical Social Worker [facility]  Phone: [phone]

## 2018-03-15 NOTE — Progress Notes (Signed)
Patient's self inventory sheet, patient has fair sleep, sleep medication helpful.  Fair appetite, normal energy level, good concentration.  Rated depression and anxiety 3, anxiety 5.  Denied withdrawals.  Denied SI.  Denied physical problems.  Physical pain, worst pain #7 in past 24 hours, stomach, chest.  No pain medicine.  Goal is make it through the day.  Plans to breathe.  Does have discharge plans.  Patient denied pain this morning while talking to nurse.

## 2018-03-15 NOTE — Tx Team (Signed)
Interdisciplinary Treatment and Diagnostic Plan Update  03/15/2018 Time of Session:  [name] MRN: [id number]  Principal Diagnosis: Major depressive disorder, recurrent episode, severe (HCC)  Secondary Diagnoses: Principal Problem:   Major depressive disorder, recurrent episode, severe (HCC) Active Problems:   Bipolar affective disorder, depressed, severe (HCC)   Current Medications:  Current Facility-Administered Medications  Medication Dose Route Frequency Provider Last Rate Last Dose  . acetaminophen (TYLENOL) tablet 650 mg  650 mg Oral Q6H PRN Charm Rings, NP   650 mg at 03/12/18 2242  . alum & mag hydroxide-simeth (MAALOX/MYLANTA) 200-200-20 MG/5ML suspension 30 mL  30 mL Oral Q4H PRN Charm Rings, NP      . buPROPion (WELLBUTRIN XL) 24 hr tablet 300 mg  300 mg Oral Daily Money, Gerlene Burdock, FNP   300 mg at 03/15/18 0753  . hydrOXYzine (ATARAX/VISTARIL) tablet 50 mg  50 mg Oral TID PRN Money, Gerlene Burdock, FNP   50 mg at 03/14/18 1708  . lamoTRIgine (LAMICTAL) tablet 25 mg  25 mg Oral Daily Cobos, Rockey Situ, MD   25 mg at 03/15/18 0752  . LORazepam (ATIVAN) tablet 0.5 mg  0.5 mg Oral Q6H PRN Cobos, Rockey Situ, MD   0.5 mg at 03/15/18 0754  . magnesium hydroxide (MILK OF MAGNESIA) suspension 30 mL  30 mL Oral Daily PRN Charm Rings, NP      . Melatonin TABS 5 mg  5 mg Oral QHS Antonieta Pert, MD   5 mg at 03/14/18 2158  . oseltamivir (TAMIFLU) capsule 75 mg  75 mg Oral Daily Judyann Munson, MD   75 mg at 03/15/18 0751  . prednisoLONE acetate (PRED FORTE) 1 % ophthalmic suspension 1 drop  1 drop Right Eye Daily Charm Rings, NP   1 drop at 03/15/18 0751  . sertraline (ZOLOFT) tablet 50 mg  50 mg Oral Daily Cobos, Rockey Situ, MD   50 mg at 03/15/18 7939   PTA Medications: Medications Prior to Admission  Medication Sig Dispense Refill Last Dose  . prednisoLONE acetate (PRED FORTE) 1 % ophthalmic suspension Place 1 drop into the right eye daily.   03/07/2018 at  Unknown time    Patient Stressors: Marital or family conflict Traumatic event  Patient Strengths: Ability for insight Average or above average intelligence Capable of independent living General fund of knowledge Motivation for treatment/growth  Treatment Modalities: Medication Management, Group therapy, Case management,  1 to 1 session with clinician, Psychoeducation, Recreational therapy.   Physician Treatment Plan for Primary Diagnosis: Major depressive disorder, recurrent episode, severe (HCC) Long Term Goal(s): Improvement in symptoms so as ready for discharge Improvement in symptoms so as ready for discharge   Short Term Goals: Ability to identify changes in lifestyle to reduce recurrence of condition will improve Ability to verbalize feelings will improve Ability to disclose and discuss suicidal ideas Ability to demonstrate self-control will improve Ability to identify and develop effective coping behaviors will improve Ability to maintain clinical measurements within normal limits will improve Ability to identify changes in lifestyle to reduce recurrence of condition will improve Ability to maintain clinical measurements within normal limits will improve  Medication Management: Evaluate patient's response, side effects, and tolerance of medication regimen.  Therapeutic Interventions: 1 to 1 sessions, Unit Group sessions and Medication administration.  Evaluation of Outcomes: Adequate for Discharge  Physician Treatment Plan for Secondary Diagnosis: Principal Problem:   Major depressive disorder, recurrent episode, severe (HCC) Active Problems:   Bipolar affective disorder,  depressed, severe (HCC)  Long Term Goal(s): Improvement in symptoms so as ready for discharge Improvement in symptoms so as ready for discharge   Short Term Goals: Ability to identify changes in lifestyle to reduce recurrence of condition will improve Ability to verbalize feelings will  improve Ability to disclose and discuss suicidal ideas Ability to demonstrate self-control will improve Ability to identify and develop effective coping behaviors will improve Ability to maintain clinical measurements within normal limits will improve Ability to identify changes in lifestyle to reduce recurrence of condition will improve Ability to maintain clinical measurements within normal limits will improve     Medication Management: Evaluate patient's response, side effects, and tolerance of medication regimen.  Therapeutic Interventions: 1 to 1 sessions, Unit Group sessions and Medication administration.  Evaluation of Outcomes: Adequate for Discharge   RN Treatment Plan for Primary Diagnosis: Major depressive disorder, recurrent episode, severe (HCC) Long Term Goal(s): Knowledge of disease and therapeutic regimen to maintain health will improve  Short Term Goals: Ability to participate in decision making will improve, Ability to verbalize feelings will improve, Ability to disclose and discuss suicidal ideas, Ability to identify and develop effective coping behaviors will improve and Compliance with prescribed medications will improve  Medication Management: RN will administer medications as ordered by provider, will assess and evaluate patient's response and provide education to patient for prescribed medication. RN will report any adverse and/or side effects to prescribing provider.  Therapeutic Interventions: 1 on 1 counseling sessions, Psychoeducation, Medication administration, Evaluate responses to treatment, Monitor vital signs and CBGs as ordered, Perform/monitor CIWA, COWS, AIMS and Fall Risk screenings as ordered, Perform wound care treatments as ordered.  Evaluation of Outcomes: Adequate for Discharge   LCSW Treatment Plan for Primary Diagnosis: Major depressive disorder, recurrent episode, severe (HCC) Long Term Goal(s): Safe transition to appropriate next level of care  at discharge, Engage patient in therapeutic group addressing interpersonal concerns.  Short Term Goals: Engage patient in aftercare planning with referrals and resources  Therapeutic Interventions: Assess for all discharge needs, 1 to 1 time with Social worker, Explore available resources and support systems, Assess for adequacy in community support network, Educate family and significant other(s) on suicide prevention, Complete Psychosocial Assessment, Interpersonal group therapy.  Evaluation of Outcomes: Adequate for Discharge   Progress in Treatment: Attending groups: Yes. Participating in groups: Yes. Taking medication as prescribed: Yes. Toleration medication: Yes. Family/Significant other contact made: No, will contact:  the patient's boyfriend; CSW has attempted to contact the patient's boyfriend multiple times. Each attempt was unsuccessful. CSW left multiple voicemails.  Patient understands diagnosis: Yes. Discussing patient identified problems/goals with staff: Yes. Medical problems stabilized or resolved: Yes. Denies suicidal/homicidal ideation: Yes. Issues/concerns per patient self-inventory: No. Other:   New problem(s) identified: None   New Short Term/Long Term Goal(s):Detox, medication stabilization, elimination of SI thoughts, development of comprehensive mental wellness plan.   Patient Goals:  Help with depression and panic attacks   Discharge Plan or Barriers: Patient plans to discharge home with her boyfriend for a short time, until he can assist her with a room at an extended stay hotel. She reports she is following up with [facility] for outpatient medication management and therapy services at discharge.   Reason for Continuation of Hospitalization: None   Estimated Length of Stay: Discharging, 03/15/2018  Attendees: Patient: 03/15/2018 11:08 AM  Physician: Dr. Nehemiah Massed, MD 03/15/2018 11:08 AM  Nursing: Meriam Sprague.Kirtland Bouchard, RN 03/15/2018 11:08 AM  RN Care Manager:  03/15/2018 11:08 AM  Social  Worker: Baldo DaubJolan Aldena Worm, Theresia MajorsLCSWA 03/15/2018 11:08 AM  Recreational Therapist:  03/15/2018 11:08 AM  Other: Marciano SequinJanet Sykes, NP  03/15/2018 11:08 AM  Other:  03/15/2018 11:08 AM  Other: 03/15/2018 11:08 AM    Scribe for Treatment Team: Maeola SarahJolan E Makenize Messman, LCSWA 03/15/2018 11:08 AM

## 2018-03-15 NOTE — Progress Notes (Signed)
Recreation Therapy Notes  Date:  2.10.20 Time: 0930 Location: [address] Dayroom  Group Topic: Stress Management  Goal Area(s) Addresses:  Patient will identify positive stress management techniques. Patient will identify benefits of using stress management post d/c.  Intervention: Stress Management  Activity :  Meditation.  LRT introduced the stress management technique of meditation.  LRT played a meditation that focused on being resilient in the face of adversity.  Patients were to listen as meditation played to engage in activity.    Education:  Stress Management, Discharge Planning.   Education Outcome: Acknowledges Education  Clinical Observations/Feedback:  Pt did not attend group.    Caramia Boutin, LRT/CTRS         Bora Bost A 03/15/2018 12:21 PM 

## 2018-03-15 NOTE — [facility] Suicide Risk Assessment (Signed)
[facility] INPATIENT:  Family/Significant Other Suicide Prevention Education  Suicide Prevention Education:   SPE completed with patient, as patient refused to consent to family contact. SPI pamphlet provided to pt and pt was encouraged to share information with support network, ask questions, and talk about any concerns relating to SPE. Patient denies access to guns/firearms and verbalized understanding of information provided. Mobile Crisis information also provided to patient.  Pharrell Ledford, MSW, LCSWA Clinical Social Worker [facility]  Phone: [phone]  

## 2018-03-15 NOTE — Progress Notes (Signed)
Discharge Note:  Patient discharged home with 2 bus tickets.  Suicide prevention information given and reviewed with patient who stated she understood and had no questions.  Patient also received My3 suicide prevention information.  Patient stated she received all her belongings, clothing, toiletries, misc items, prescriptions, etc.  Patient stated [facility].  All required discharge information given to patient at discharge.

## 2018-03-15 NOTE — Discharge Summary (Addendum)
Physician Discharge Summary Note  Patient:  [name] is an 23 y.o., female MRN:  [id number] DOB:  March 17, 1995  Patient phone:  [phone] (home)   Patient address:   [address] [place] [address],   Total Time spent with patient: Greater than 30 minutes  Date of Admission:  03/08/2018  Date of Discharge: 03-15-18  Reason for Admission: "Talking about killing herself" due to worsening symptoms of Bipolar disorder)  Principal Problem: Major depressive disorder, recurrent episode, severe (HCC)  Discharge Diagnoses: Principal Problem:   Major depressive disorder, recurrent episode, severe (HCC) Active Problems:   Bipolar affective disorder, depressed, severe (HCC)  Past Psychiatric History: Bipolar affective disorder.  Past Medical History:  Past Medical History:  Diagnosis Date  . Anxiety   . Depression   . Headache   . Keratoconus of left eye   . Mental disorder   . Obesity     Past Surgical History:  Procedure Laterality Date  . CORNEAL TRANSPLANT  2016   per pt report  . WISDOM TOOTH EXTRACTION  Oct 2012   all four   Family History:  Family History  Problem Relation Age of Onset  . Bipolar disorder Maternal Grandmother    Family Psychiatric  History: See H&P Social History:  Social History   Substance and Sexual Activity  Alcohol Use No     Social History   Substance and Sexual Activity  Drug Use No    Social History   Socioeconomic History  . Marital status: Single    Spouse name: Not on file  . Number of children: Not on file  . Years of education: Not on file  . Highest education level: Not on file  Occupational History  . 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 Financial resource strain: Not on file  . Food insecurity:    Worry: Not on file    Inability: Not on file  . Transportation needs:    Medical: Not on file    Non-medical: Not on file  Tobacco Use  . Smoking status:  Never Smoker  . Smokeless tobacco: Never Used  Substance and Sexual Activity  . Alcohol use: No  . Drug use: No  . Sexual activity: Yes  Lifestyle  . Physical activity:    Days per week: Not on file    Minutes per session: Not on file  . Stress: Not on file  Relationships  . Social connections:    Talks on phone: Not on file    Gets together: Not on file    Attends religious service: Not on file    Active member of club or organization: Not on file    Attends meetings of clubs or organizations: Not on file    Relationship status: Not on file  Other Topics Concern  . Not on file  Social History Narrative  . Not on file   Hospital Course: (Per Md's admission evaluation): 23 year old female, presented to ED on 2/3 under IVC which she states was generated by family . As per notes , IVC states ""respondent is bipolar. She has been talking about killing herself and has been running in and out of the street. She told her father/petitioner that she is tired of living the way she is living. Respondent said she is tired of being depressed and wants to die. When she is depressed, she drinks whole bottles of wine". Patient reports she has  been feeling more depressed , and does acknowledge history of bipolar disorder diagnosis in the past , but states she has a difficult , distant relationship with her father, and feels that IVC was at least in part  unwarranted and vindictive.  States she has had some passive thoughts of death, dying lately but denies any suicidal plan or intention. Endorses neuro-vegetative symptoms as below. Denies psychotic symptoms. Of note, regarding alcohol, patient states she drinks occasionally, last drank heavily several weeks ago (isolated episode), and denies any recent alcohol consumption.  [name] was admitted to the [facility] adult unit for crisis management due to worsening symptoms of Bipolar disorder triggering suicidal thoughts. She was involuntarily committed to the  hospital by her family for evaluation of her symptoms & treatment. After evaluation of her presenting symptoms, [name] was recommended for mood stabilization treatment. The medication regimen for her presenting symptoms were discussed & initiated. The medication to be used were discussed with her with their indications & side effects explained to her as well. Her other pre-existing medical problems were identified & treated accordingly by resuming her home medications as deemed appropriate. She was medicated, stabilized & discharged on the medications as listed below.  During the course of her hosptalization, [name]'s improvement was monitored by observation & her daily report of symptom reduction noted.  Her emotional & mental status were monitored by the daily self-inventory reports completed by her & the clinical staff. She reported continued improvement on daily basis & denied any new concerns. She was encouraged to attend group sessions to learn coping skills to help with recognizing triggers of her emotional crises & ways to cope better with them.         [name] was evaluated by the treatment team on daily basis for mood stability and plans for continued recovery after discharge. She was offered further treatment options upon discharge on an outpatient basis as noted below.  She was encouraged to maintain satisfactory support network and home environment as this will assist in her maintaining mood stability. She was instructed & encouraged to adhere to her medication regimen as recommended by her treatment team.     Upon her hospital discharge today, [name] was both mentally and medically stable denying suicidal/homicidal ideation, auditory/visual/tactile hallucinations, delusional thoughts and paranoia. She was able to engage in safety planning including plan to return to [facility] or contact emergency services if she feels unable to maintain [place]rown safety or the safety of others. Pt had no further  questions, comments or concerns. She left [name] with all personal belongings in no apparent distress.  Physical Findings: AIMS: Facial and Oral Movements Muscles of Facial Expression: None, normal Lips and Perioral Area: None, normal Jaw: None, normal Tongue: None, normal,Extremity Movements Upper (arms, wrists, hands, fingers): None, normal Lower (legs, knees, ankles, toes): None, normal, Trunk Movements Neck, shoulders, hips: None, normal, Overall Severity Severity of abnormal movements (highest score from questions above): None, normal Incapacitation due to abnormal movements: None, normal Patient's awareness of abnormal movements (rate only patient's report): No Awareness, Dental Status Current problems with teeth and/or dentures?: No Does patient usually wear dentures?: No  CIWA:  CIWA-Ar Total: 1 COWS:  COWS Total Score: 2  Musculoskeletal: Strength & Muscle Tone: within normal limits Gait & Station: normal Patient leans: N/A  Psychiatric Specialty Exam: Physical Exam  Nursing note and vitals reviewed. Constitutional: She is oriented to person, place, and time. She appears well-developed.  HENT:  Head: Normocephalic.  Eyes: Pupils are equal, round,  and reactive to light.  Neck: Normal range of motion.  Cardiovascular: Normal rate.  Respiratory: [place] normal.  GI: Soft.  Genitourinary:    Genitourinary Comments: Deferred   Musculoskeletal: Normal range of motion.  Neurological: She is alert and oriented to person, place, and time.  Skin: Skin is warm and dry.    Review of Systems  Constitutional: Negative.   HENT: Negative.   Eyes: Negative.   Respiratory: Negative.  Negative for cough and shortness of breath.   Cardiovascular: Negative.  Negative for chest pain and palpitations.  Gastrointestinal: Negative.  Negative for abdominal pain, heartburn, nausea and vomiting.  Genitourinary: Negative.   Musculoskeletal: Negative.   Skin: Negative.   Neurological:  Negative.  Negative for dizziness and headaches.  Endo/Heme/Allergies: Negative.   Psychiatric/Behavioral: Positive for depression (Stabilized with medication prior to discharge). Negative for hallucinations, memory loss, substance abuse and suicidal ideas. The patient has insomnia (Stabilized with medication prior to discharge). The patient is not nervous/anxious (Stable).     Blood pressure 129/89, pulse 95, temperature 97.7 F (36.5 C), temperature source Oral, resp. rate 16, height 5\' 5"  (1.651 m), weight 84.4 kg, SpO2 100 %, unknown if currently breastfeeding.Body mass index is 30.95 kg/m.  See Md's discharge SRA   Have you used any form of tobacco in the last 30 days? (Cigarettes, Smokeless Tobacco, Cigars, and/or Pipes): No  Has this patient used any form of tobacco in the last 30 days? (Cigarettes, Smokeless Tobacco, Cigars, and/or Pipes): N/A  Blood Alcohol level:  Lab Results  Component Value Date   ETH <10 03/08/2018   ETH <10 06/20/2017    Metabolic Disorder Labs:  No results found for: HGBA1C, MPG Lab Results  Component Value Date   PROLACTIN 20.8 03/07/2011   No results found for: CHOL, TRIG, HDL, CHOLHDL, VLDL, LDLCALC  See Psychiatric Specialty Exam and Suicide Risk Assessment completed by Attending Physician prior to discharge.  Discharge destination:  Home  Is patient on multiple antipsychotic therapies at discharge:  No   Has Patient had three or more failed trials of antipsychotic monotherapy by history:  No  Recommended Plan for Multiple Antipsychotic Therapies: NA  Discharge Instructions    Discharge instructions   Complete by:  As directed    Patient is instructed to take all prescribed medications as recommended. Report any side effects or adverse reactions to your outpatient psychiatrist. Patient is instructed to abstain from alcohol and illegal drugs while on prescription medications. In the event of worsening symptoms, patient is instructed to  call the crisis hotline, 911, or go to the nearest emergency department for evaluation and treatment.     Allergies as of 03/15/2018      Reactions   Shrimp [shellfish Allergy] Anaphylaxis   Clindamycin/lincomycin Hives      Medication List    TAKE these medications     Indication  buPROPion 300 MG 24 hr tablet Commonly known as:  WELLBUTRIN XL Take 1 tablet (300 mg total) by mouth daily. For mood Start taking on:  March 16, 2018  Indication:  Mood   hydrOXYzine 50 MG tablet Commonly known as:  ATARAX/VISTARIL Take 1 tablet (50 mg total) by mouth 3 (three) times daily as needed for anxiety (sleep).  Indication:  Feeling Anxious   lamoTRIgine 25 MG tablet Commonly known as:  LAMICTAL Take 1 tablet (25 mg total) by mouth daily. For mood Start taking on:  March 16, 2018  Indication:  Mood   LORazepam 0.5 MG tablet  Commonly known as:  ATIVAN Take 1 tablet (0.5 mg total) by mouth every 6 (six) hours as needed for anxiety or sleep.  Indication:  Feeling Anxious   Melatonin 5 MG Tabs Take 1 tablet (5 mg total) by mouth at bedtime. For sleep  Indication:  Trouble Sleeping   oseltamivir 75 MG capsule Commonly known as:  TAMIFLU Take 1 capsule (75 mg total) by mouth daily. For flu prevention Start taking on:  March 16, 2018  Indication:  Influenza prophylaxis   prednisoLONE acetate 1 % ophthalmic suspension Commonly known as:  PRED FORTE Place 1 drop into the right eye daily.  Indication:  Allergic Conjunctivitis   sertraline 50 MG tablet Commonly known as:  ZOLOFT Take 1 tablet (50 mg total) by mouth daily. For mood Start taking on:  March 16, 2018  Indication:  Mood      Follow-up Information    [facility] Follow up on 03/18/2018.   Why:  Hospital follow up appointment is 2/13 at 8:00a. Please bring your photo ID, proof of insurance, SSN, current medications, and discharge paperwork from this hospitalization.  Contact information: [address] [place] [id number] [phone]          Follow-up recommendations: Activity:  As tolerated Diet: As recommended by your primary care doctor. Keep all scheduled follow-up appointments as recommended.   Comments: Patient is instructed prior to discharge to: Take all medications as prescribed by his/her mental healthcare provider. Report any adverse effects and or reactions from the medicines to his/her outpatient provider promptly. Patient has been instructed & cautioned: To not engage in alcohol and or illegal drug use while on prescription medicines. In the event of worsening symptoms, patient is instructed to call the crisis hotline, 911 and or go to the nearest ED for appropriate evaluation and treatment of symptoms. To follow-up with his/her primary care provider for your other medical issues, concerns and or health care needs.   Signed: Armandina StammerAgnes Nwoko, NP , PMHNP, FNP-BC 03/15/2018, 1:42 PM   Patient seen, Suicide Assessment Completed.  Disposition Plan Reviewed

## 2018-03-15 NOTE — Progress Notes (Signed)
CSW attempted to contact the patient's boyfriend, Marvia Pickles [phone]) multiple times to determine whether or not the patient was discharging to his home and to discuss other discharge plans.   The patient reports that she and her boyfriend discussed that she would discharge home with him for a short period, and that he would assist her with finding an extended stay hotel room until arrangements could be made.   The patient shared that she and her boyfriend currently have an open CPS case with their 62 month old son and  that she or her boyfriend cannot be around their son at this time. The patient reports that her boyfriend is her only support and that he agreed for her to return to his home once she discharged from the hospital. The patient reports that her 105 month old is currently in the custody of her boyfriend's parents.   CSW was unable to confirm this plan due to the patient' boyfriend not answering or returning calls.   CSW spoke with the patient's CPS caseworker, Leigh Aurora and there was no concerns with the patient discharging today. Please see previous progress note for additional information.  CSW will continue to follow for a safe discharge.    Baldo Daub, MSW, LCSWA Clinical Social Worker [facility]  Phone: [phone]

## 2018-08-24 ENCOUNTER — Emergency Department (HOSPITAL_COMMUNITY): Payer: Medicaid Other

## 2018-08-24 ENCOUNTER — Encounter (HOSPITAL_COMMUNITY): Payer: Self-pay | Admitting: *Deleted

## 2018-08-24 ENCOUNTER — Emergency Department (HOSPITAL_COMMUNITY)
Admission: EM | Admit: 2018-08-24 | Discharge: 2018-08-24 | Disposition: A | Discharge status: Home / Self Care | Payer: Medicaid Other | Attending: Emergency Medicine | Admitting: Emergency Medicine

## 2018-08-24 ENCOUNTER — Other Ambulatory Visit: Payer: Self-pay

## 2018-08-24 DIAGNOSIS — R55 Syncope and collapse: Secondary | ICD-10-CM

## 2018-08-24 DIAGNOSIS — T679XXA Effect of heat and light, unspecified, initial encounter: Secondary | ICD-10-CM | POA: Insufficient documentation

## 2018-08-24 LAB — CBC
HCT: 41.2 % (ref 36.0–46.0)
Hemoglobin: 14.2 g/dL (ref 12.0–15.0)
MCH: 30.5 pg (ref 26.0–34.0)
MCHC: 34.5 g/dL (ref 30.0–36.0)
MCV: 88.4 fL (ref 80.0–100.0)
Platelets: 393 10*3/uL (ref 150–400)
RBC: 4.66 MIL/uL (ref 3.87–5.11)
RDW: 12.2 % (ref 11.5–15.5)
WBC: 8 10*3/uL (ref 4.0–10.5)
nRBC: 0 % (ref 0.0–0.2)

## 2018-08-24 LAB — CBG MONITORING, ED: Glucose-Capillary: 88 mg/dL (ref 70–99)

## 2018-08-24 LAB — BASIC METABOLIC PANEL
Anion gap: 9 (ref 5–15)
BUN: 8 mg/dL (ref 6–20)
CO2: 23 mmol/L (ref 22–32)
Calcium: 9.4 mg/dL (ref 8.9–10.3)
Chloride: 106 mmol/L (ref 98–111)
Creatinine, Ser: 0.64 mg/dL (ref 0.44–1.00)
GFR calc Af Amer: >60 mL/min (ref >60)
GFR calc non Af Amer: >60 mL/min (ref >60)
Glucose, Bld: 102 mg/dL — ABNORMAL HIGH (ref 70–99)
Potassium: 3.6 mmol/L (ref 3.5–5.1)
Sodium: 138 mmol/L (ref 135–145)

## 2018-08-24 LAB — I-STAT BETA HCG BLOOD, ED (MC, WL, AP ONLY): I-stat hCG, quantitative: <5 m[IU]/mL (ref <5)

## 2018-08-24 MED ORDER — SODIUM CHLORIDE 0.9% FLUSH: 3.0000 mL | Freq: Once | INTRAVENOUS | Status: DC

## 2018-08-24 MED ORDER — IBUPROFEN 800 MG PO TABS
800.0000 mg | ORAL_TABLET | Freq: Once | ORAL | Status: AC
  Administered 2018-08-24: 800 mg via ORAL
  Filled 2018-08-24: qty 1

## 2018-08-24 NOTE — ED Notes (Signed)
Patient verbalizes understanding of discharge instructions. Opportunity for questioning and answers were provided. Armband removed by staff, pt discharged from ED ambulatory.   

## 2018-08-24 NOTE — Discharge Instructions (Signed)
We suspect you passed out today from dehydration and heat exposure. Try to make sure to drink lots of fluids, especially when outside during the day. Make sure to eat regular meals. Follow-up with your primary care doctor. Can take tylenol or motrin for headache when needed. Return here for any new/acute changes.

## 2018-08-24 NOTE — ED Provider Notes (Signed)
[facility] Provider Note   CSN: [id number] Arrival date & time: 08/24/18  0258     History   Chief Complaint Chief Complaint  Patient presents with  . Loss of Consciousness    HPI [name] is a 23 y.o. female.     The history is provided by the patient and medical records.  Loss of Consciousness    23 y.o. F with hx of depression, anxiety, headaches, ADHD, presenting to the ED following a syncopal event.  Patient reports she was walking down the front steps of her house today when she started to feel dizzy, saw black spots, then woke up on the ground at the bottom of the steps.  No idea how long she was unconscious.  She states she has been outside in the heat a lot the past few days as she does not have a car so has to walk wherever she needs to go.  She also admits she has not eaten or had anything to drink all day today.  No chest pain or SOB.  States she is no longer dizzy, now just has pain on the side of her head at area of impact.  Not currently on anticoagulation.  No fevers or other recent illness.  Past Medical History:  Diagnosis Date  . Anxiety   . Depression   . Headache   . Keratoconus of left eye   . Mental disorder   . Obesity     Patient Active Problem List   Diagnosis Date Noted  . Major depressive disorder, recurrent episode, severe (HCC) 03/10/2018  . Spontaneous vaginal delivery 09/18/2017  . Indication for care or intervention related to labor and delivery 09/17/2017  . Post-dates pregnancy 09/17/2017  . Abnormal urine odor 01/30/2017  . Back pain affecting pregnancy 01/30/2017  . Bipolar affective disorder, depressed, severe (HCC) 03/07/2011  . Attention-deficit hyperactivity disorder, combined type 03/07/2011  . Oppositional defiant disorder 03/07/2011  . Post traumatic stress disorder 03/07/2011    Past Surgical History:  Procedure Laterality Date  . CORNEAL TRANSPLANT  2016   per pt report   . WISDOM TOOTH EXTRACTION  Oct 2012   all four     OB History    Gravida  1   Para  1   Term  1   Preterm      AB      Living  1     SAB      TAB      Ectopic      Multiple  0   Live Births  1            Home Medications    Prior to Admission medications   Medication Sig Start Date End Date Taking? Authorizing Provider  buPROPion (WELLBUTRIN XL) 300 MG 24 hr tablet Take 1 tablet (300 mg total) by mouth daily. For mood 03/16/18   Aldean BakerSykes, Janet E, NP  hydrOXYzine (ATARAX/VISTARIL) 50 MG tablet Take 1 tablet (50 mg total) by mouth 3 (three) times daily as needed for anxiety (sleep). 03/15/18   Aldean BakerSykes, Janet E, NP  lamoTRIgine (LAMICTAL) 25 MG tablet Take 1 tablet (25 mg total) by mouth daily. For mood 03/16/18   Aldean BakerSykes, Janet E, NP  LORazepam (ATIVAN) 0.5 MG tablet Take 1 tablet (0.5 mg total) by mouth every 6 (six) hours as needed for anxiety or sleep. 03/15/18   Aldean BakerSykes, Janet E, NP  Melatonin 5 MG TABS Take 1 tablet (5  mg total) by mouth at bedtime. For sleep 03/15/18   Aldean BakerSykes, Janet E, NP  oseltamivir (TAMIFLU) 75 MG capsule Take 1 capsule (75 mg total) by mouth daily. For flu prevention 03/16/18   Aldean BakerSykes, Janet E, NP  prednisoLONE acetate (PRED FORTE) 1 % ophthalmic suspension Place 1 drop into the right eye daily. 12/30/17   [provider]  sertraline (ZOLOFT) 50 MG tablet Take 1 tablet (50 mg total) by mouth daily. For mood 03/16/18   Aldean BakerSykes, Janet E, NP    Family History Family History  Problem Relation Age of Onset  . Bipolar disorder Maternal Grandmother     Social History Social History   Tobacco Use  . Smoking status: Never Smoker  . Smokeless tobacco: Never Used  Substance Use Topics  . Alcohol use: No  . Drug use: No     Allergies   Shrimp [shellfish allergy] and Clindamycin/lincomycin   Review of Systems Review of Systems  Cardiovascular: Positive for syncope.  Neurological: Positive for syncope.  All other systems reviewed and are  negative.    Physical Exam Updated Vital Signs BP 112/87 (BP Location: Right Arm)   Pulse 86   Temp 97.9 F (36.6 C) (Oral)   Resp 15   LMP 08/16/2018   SpO2 100%   Physical Exam Vitals signs and nursing note reviewed.  Constitutional:      Appearance: She is well-developed.  HENT:     Head: Normocephalic and atraumatic.     Comments: Minor contusion to left forehead/temple; no hematoma or skull depression    Ears:     Comments: No hemotympanum Eyes:     Conjunctiva/sclera: Conjunctivae normal.     Pupils: Pupils are equal, round, and reactive to light.     Comments: PERRL, no nystagmus, normal confrontation  Neck:     Musculoskeletal: Normal range of motion.  Cardiovascular:     Rate and Rhythm: Normal rate and regular rhythm.     Heart sounds: Normal heart sounds.  Pulmonary:     Effort: Pulmonary effort is normal.     Breath sounds: Normal breath sounds.  Abdominal:     General: Bowel sounds are normal.     Palpations: Abdomen is soft.  Musculoskeletal: Normal range of motion.     Comments: Blue/green dye present on hands (from hair)  Skin:    General: Skin is warm and dry.  Neurological:     Mental Status: She is alert and oriented to person, place, and time.     Comments: AAOx3, answering questions and following commands appropriately; equal strength UE and LE bilaterally; CN grossly intact; moves all extremities appropriately without ataxia; no focal neuro deficits or facial asymmetry appreciated      ED Treatments / Results  Labs (all labs ordered are listed, but only abnormal results are displayed) Labs Reviewed  BASIC METABOLIC PANEL - Abnormal; Notable for the following components:      Result Value   Glucose, Bld 102 (*)    All other components within normal limits  CBC  URINALYSIS, ROUTINE W REFLEX MICROSCOPIC  CBG MONITORING, ED  I-STAT BETA HCG BLOOD, ED ([facility], [facility], [facility] ONLY)    EKG EKG Interpretation  Date/Time:  Tuesday August 24 2018  03:33:29 EDT Ventricular Rate:  95 PR Interval:  128 QRS Duration: 72 QT Interval:  350 QTC Calculation: 439 R Axis:   39 Text Interpretation:  Normal sinus rhythm Nonspecific T wave abnormality Abnormal ECG No previous ECGs available Confirmed by  Ezequiel Essex [phone]) on 08/24/2018 4:59:00 AM   Radiology Ct Head Wo Contrast  Result Date: 08/24/2018 CLINICAL DATA:  Syncope EXAM: CT HEAD WITHOUT CONTRAST TECHNIQUE: Contiguous axial images were obtained from the base of the skull through the vertex without intravenous contrast. COMPARISON:  None. FINDINGS: Brain: No evidence of acute infarction, hemorrhage, hydrocephalus, extra-axial collection or mass lesion/mass effect. Vascular: No hyperdense vessel or unexpected calcification. Skull: Normal. Negative for fracture or focal lesion. Sinuses/Orbits: No acute finding. IMPRESSION: Negative head CT. Electronically Signed   By: Monte Fantasia M.D.   On: 08/24/2018 04:39    Procedures Procedures (including critical care time)  Medications Ordered in ED Medications  sodium chloride flush (NS) 0.9 % injection 3 mL (has no administration in time range)  ibuprofen (ADVIL) tablet 800 mg (has no administration in time range)     Initial Impression / Assessment and Plan / ED Course  I have reviewed the triage vital signs and the nursing notes.  Pertinent labs & imaging results that were available during my care of the patient were reviewed by me and considered in my medical decision making (see chart for details).  23 year old female here for syncopal event.  She was walking down the front steps to her house when she began to feel lightheaded, dizzy, saw black spots in vision, and woke up on the ground at the bottom of the steps.  Unsure how long she was unconscious.  States she starting to feel better, head is just now "sore".  She is awake, alert, appropriately oriented here.  She does not have any focal neurologic deficits.  She has a small  area of contusion to the left forehead/temple but no skull depression or hematoma.  Labs and head CT obtained from triage are overall reassuring.  She does not have any concussive type symptoms.  She does report she has not eaten or had anything to drink today and is out in the heat quite a bit as she has to walk to get to where she needs to go.  I have encouraged good oral hydration, regular meals, limit midday heat exposure.  Can follow-up with PCP.  Continue Tylenol/Motrin for headache.  Return here for any new or acute changes.  Final Clinical Impressions(s) / ED Diagnoses   Final diagnoses:  Syncope and collapse  Heat exposure, initial encounter    ED Discharge Orders    None       Larene Pickett, PA-C 08/24/18 [id number]    Ezequiel Essex, MD 08/24/18 [phone]

## 2018-08-24 NOTE — ED Triage Notes (Signed)
Pt says that she was getting ready to walk down the step and she blacked out, woke up leaning against the wooden post of the steps. She c/o pain in the left side of the head, associated with dizziness, no visual changes/vomiting. She says that lately she has been feeling dizzy when out in the heat.

## 2019-09-24 ENCOUNTER — Other Ambulatory Visit: Payer: Self-pay

## 2019-09-24 ENCOUNTER — Encounter (HOSPITAL_COMMUNITY): Payer: Self-pay | Admitting: Emergency Medicine

## 2019-09-24 ENCOUNTER — Emergency Department (HOSPITAL_COMMUNITY)
Admission: EM | Admit: 2019-09-24 | Discharge: 2019-09-25 | Disposition: A | Discharge status: Home / Self Care | Payer: Medicaid Other | Attending: Emergency Medicine | Admitting: Emergency Medicine

## 2019-09-24 DIAGNOSIS — J029 Acute pharyngitis, unspecified: Secondary | ICD-10-CM | POA: Insufficient documentation

## 2019-09-24 DIAGNOSIS — R0789 Other chest pain: Secondary | ICD-10-CM | POA: Insufficient documentation

## 2019-09-24 DIAGNOSIS — Z5321 Procedure and treatment not carried out due to patient leaving prior to being seen by health care provider: Secondary | ICD-10-CM | POA: Diagnosis not present

## 2019-09-24 DIAGNOSIS — R519 Headache, unspecified: Secondary | ICD-10-CM | POA: Insufficient documentation

## 2019-09-24 LAB — BASIC METABOLIC PANEL
Anion gap: 9 (ref 5–15)
BUN: 7 mg/dL (ref 6–20)
CO2: 22 mmol/L (ref 22–32)
Calcium: 9.2 mg/dL (ref 8.9–10.3)
Chloride: 108 mmol/L (ref 98–111)
Creatinine, Ser: 0.57 mg/dL (ref 0.44–1.00)
GFR calc Af Amer: >60 mL/min (ref >60)
GFR calc non Af Amer: >60 mL/min (ref >60)
Glucose, Bld: 108 mg/dL — ABNORMAL HIGH (ref 70–99)
Potassium: 3.8 mmol/L (ref 3.5–5.1)
Sodium: 139 mmol/L (ref 135–145)

## 2019-09-24 LAB — CBC
HCT: 40.7 % (ref 36.0–46.0)
Hemoglobin: 13.6 g/dL (ref 12.0–15.0)
MCH: 29.7 pg (ref 26.0–34.0)
MCHC: 33.4 g/dL (ref 30.0–36.0)
MCV: 88.9 fL (ref 80.0–100.0)
Platelets: 385 10*3/uL (ref 150–400)
RBC: 4.58 MIL/uL (ref 3.87–5.11)
RDW: 13 % (ref 11.5–15.5)
WBC: 7.7 10*3/uL (ref 4.0–10.5)
nRBC: 0 % (ref 0.0–0.2)

## 2019-09-24 LAB — TROPONIN I (HIGH SENSITIVITY)
Troponin I (High Sensitivity): <2 ng/L (ref <18)
Troponin I (High Sensitivity): <2 ng/L (ref <18)

## 2019-09-24 NOTE — ED Triage Notes (Signed)
C/o pain to center of chest, sore throat, and headache since yesterday.

## 2019-09-25 NOTE — ED Notes (Signed)
No answer x2 

## 2019-09-25 NOTE — ED Notes (Signed)
Patient called x1 for vitals recheck with no response 

## 2020-03-20 ENCOUNTER — Encounter (HOSPITAL_COMMUNITY): Payer: Self-pay | Admitting: Emergency Medicine

## 2020-03-20 ENCOUNTER — Emergency Department (HOSPITAL_COMMUNITY)
Admission: EM | Admit: 2020-03-20 | Discharge: 2020-03-20 | Disposition: A | Discharge status: Home / Self Care | Payer: Medicaid Other | Attending: Emergency Medicine | Admitting: Emergency Medicine

## 2020-03-20 ENCOUNTER — Other Ambulatory Visit: Payer: Self-pay

## 2020-03-20 DIAGNOSIS — R21 Rash and other nonspecific skin eruption: Secondary | ICD-10-CM | POA: Diagnosis not present

## 2020-03-20 MED ORDER — TRIAMCINOLONE ACETONIDE 0.1 % EX CREA: 1.0000 "application " | TOPICAL_CREAM | Freq: Two times a day (BID) | CUTANEOUS | 0 refills | Status: DC

## 2020-03-20 NOTE — ED Provider Notes (Signed)
[facility]-EMERGENCY DEPT Provider Note   CSN: [id number] Arrival date & time: 03/20/20  1010     History Chief Complaint  Patient presents with  . Rash    [name] is a 25 y.o. female.  25 year old female with history of hidradenitis presents with complaint of rash.  Patient states that her doctor recently started her on doxycycline for her hidradenitis, reports clindamycin allergy.  Patient is broke out in a rash across her groin, abdomen, axillary areas which is painful.  Denies itching, respiratory symptoms.  No other complaints or concerns today.        Past Medical History:  Diagnosis Date  . Anxiety   . Depression   . Headache   . Keratoconus of left eye   . Mental disorder   . Obesity     Patient Active Problem List   Diagnosis Date Noted  . Major depressive disorder, recurrent episode, severe (HCC) 03/10/2018  . Spontaneous vaginal delivery 09/18/2017  . Indication for care or intervention related to labor and delivery 09/17/2017  . Post-dates pregnancy 09/17/2017  . Abnormal urine odor 01/30/2017  . Back pain affecting pregnancy 01/30/2017  . Bipolar affective disorder, depressed, severe (HCC) 03/07/2011  . Attention-deficit hyperactivity disorder, combined type 03/07/2011  . Oppositional defiant disorder 03/07/2011  . Post traumatic stress disorder 03/07/2011    Past Surgical History:  Procedure Laterality Date  . CORNEAL TRANSPLANT  2016   per pt report  . WISDOM TOOTH EXTRACTION  Oct 2012   all four     OB History    Gravida  1   Para  1   Term  1   Preterm      AB      Living  1     SAB      IAB      Ectopic      Multiple  0   Live Births  1           Family History  Problem Relation Age of Onset  . Bipolar disorder Maternal Grandmother     Social History   Tobacco Use  . Smoking status: Never Smoker  . Smokeless tobacco: Never Used  Vaping Use  . Vaping Use: Never used   Substance Use Topics  . Alcohol use: No  . Drug use: No    Home Medications Prior to Admission medications   Medication Sig Start Date End Date Taking? Authorizing Provider  triamcinolone (KENALOG) 0.1 % Apply 1 application topically 2 (two) times daily. 03/20/20  Yes Jeannie Fend, PA-C  buPROPion (WELLBUTRIN XL) 300 MG 24 hr tablet Take 1 tablet (300 mg total) by mouth daily. For mood 03/16/18   Aldean Baker, NP  hydrOXYzine (ATARAX/VISTARIL) 50 MG tablet Take 1 tablet (50 mg total) by mouth 3 (three) times daily as needed for anxiety (sleep). 03/15/18   Aldean Baker, NP  lamoTRIgine (LAMICTAL) 25 MG tablet Take 1 tablet (25 mg total) by mouth daily. For mood 03/16/18   Aldean Baker, NP  LORazepam (ATIVAN) 0.5 MG tablet Take 1 tablet (0.5 mg total) by mouth every 6 (six) hours as needed for anxiety or sleep. 03/15/18   Aldean Baker, NP  Melatonin 5 MG TABS Take 1 tablet (5 mg total) by mouth at bedtime. For sleep 03/15/18   Aldean Baker, NP  oseltamivir (TAMIFLU) 75 MG capsule Take 1 capsule (75 mg total) by mouth daily. For flu prevention 03/16/18  Aldean Baker, NP  prednisoLONE acetate (PRED FORTE) 1 % ophthalmic suspension Place 1 drop into the right eye daily. 12/30/17   [provider]  sertraline (ZOLOFT) 50 MG tablet Take 1 tablet (50 mg total) by mouth daily. For mood 03/16/18   Aldean Baker, NP    Allergies    Shrimp [shellfish allergy] and Clindamycin/lincomycin  Review of Systems   Review of Systems  Constitutional: Negative for fever.  HENT: Negative for trouble swallowing and voice change.   Respiratory: Negative for cough and shortness of breath.   Gastrointestinal: Negative for nausea and vomiting.  Musculoskeletal: Negative for arthralgias, joint swelling and myalgias.  Skin: Positive for rash.  Neurological: Negative for weakness.  All other systems reviewed and are negative.   Physical Exam Updated Vital Signs BP 126/74   Pulse 85   Temp  98.3 F (36.8 C) (Oral)   Resp 16   LMP 03/20/2020   SpO2 100%   Physical Exam Vitals and nursing note reviewed.  Constitutional:      General: She is not in acute distress.    Appearance: She is well-developed and well-nourished. She is not diaphoretic.  HENT:     Head: Normocephalic and atraumatic.  Cardiovascular:     Rate and Rhythm: Normal rate.     Heart sounds: Normal heart sounds.  Pulmonary:     Effort: Pulmonary effort is normal.     Breath sounds: Normal breath sounds.  Abdominal:     Palpations: Abdomen is soft.     Tenderness: There is no abdominal tenderness.  Skin:    General: Skin is warm and dry.     Findings: Rash present. No erythema.  Neurological:     Mental Status: She is alert and oriented to person, place, and time.  Psychiatric:        Mood and Affect: Mood and affect normal.        Behavior: Behavior normal.     ED Results / Procedures / Treatments   Labs (all labs ordered are listed, but only abnormal results are displayed) Labs Reviewed - No data to display  EKG None  Radiology No results found.  Procedures Procedures   Medications Ordered in ED Medications - No data to display  ED Course  I have reviewed the triage vital signs and the nursing notes.  Pertinent labs & imaging results that were available during my care of the patient were reviewed by me and considered in my medical decision making (see chart for details).  Clinical Course as of 03/20/20 1255  Tue Mar 20, 2020  [id number] 25 year old female with rash after starting doxycycline.  On exam found to have faint barely appreciable rash to inner thighs and upper chest areas.  Advised to discontinue the doxycycline, follow with her PCP, will give prescription for triamcinolone to apply. [LM]    Clinical Course User Index [LM] Alden Hipp   MDM Rules/Calculators/A&P                          Final Clinical Impression(s) / ED Diagnoses Final diagnoses:  Rash     Rx / DC Orders ED Discharge Orders         Ordered    triamcinolone (KENALOG) 0.1 %  2 times daily        03/20/20 1153           Jeannie Fend, PA-C 03/20/20 1255  Mancel Bale, MD 03/20/20 [phone]

## 2020-03-20 NOTE — ED Triage Notes (Signed)
Per pt, states she has a rash between her legs spreading to her abdomin and under her breasts-states she has a skin condition but does not think symptoms are related-states her PCP prescribed her an antibiotic a few days ago-not sure if she is allergic to it

## 2020-03-20 NOTE — Discharge Instructions (Signed)
Stop the doxycycline. Apply triamcinolone cream as needed as prescribed. Follow-up with your doctor.

## 2020-05-06 ENCOUNTER — Emergency Department (HOSPITAL_COMMUNITY)
Admission: EM | Admit: 2020-05-06 | Discharge: 2020-05-06 | Disposition: A | Discharge status: Home / Self Care | Payer: Medicaid Other | Attending: Emergency Medicine | Admitting: Emergency Medicine

## 2020-05-06 ENCOUNTER — Other Ambulatory Visit: Payer: Self-pay

## 2020-05-06 ENCOUNTER — Encounter (HOSPITAL_COMMUNITY): Payer: Self-pay | Admitting: Emergency Medicine

## 2020-05-06 DIAGNOSIS — J069 Acute upper respiratory infection, unspecified: Secondary | ICD-10-CM | POA: Diagnosis not present

## 2020-05-06 DIAGNOSIS — Z20822 Contact with and (suspected) exposure to covid-19: Secondary | ICD-10-CM | POA: Insufficient documentation

## 2020-05-06 DIAGNOSIS — R059 Cough, unspecified: Secondary | ICD-10-CM | POA: Diagnosis present

## 2020-05-06 DIAGNOSIS — E119 Type 2 diabetes mellitus without complications: Secondary | ICD-10-CM | POA: Insufficient documentation

## 2020-05-06 HISTORY — DX: Type 2 diabetes mellitus without complications: E11.9

## 2020-05-06 MED ORDER — CHLORASEPTIC MOUTH PAIN 1.4 % MT LIQD: 1.0000 | OROMUCOSAL | 0 refills | Status: DC | PRN

## 2020-05-06 MED ORDER — BENZONATATE 100 MG PO CAPS: 100.0000 mg | ORAL_CAPSULE | Freq: Three times a day (TID) | ORAL | 0 refills | Status: AC

## 2020-05-06 NOTE — ED Triage Notes (Signed)
Patient reports runny nose, sore throat, cough, and body aches x2 days. States negative rapid Covid at home. Requesting work note.

## 2020-05-06 NOTE — Discharge Instructions (Signed)
Your covid test with result tomorrow. You can check the result on mychart. If positive, You should be isolated for at least 5 days since the onset of your symptoms AND >72 hours after symptoms resolution (absence of fever without the use of fever reducing medicaiton and improvement in respiratory symptoms), whichever is longer  Please follow up with your primary care provider within 5-7 days for re-evaluation of your symptoms. If you do not have a primary care provider, information for a healthcare clinic has been provided for you to make arrangements for follow up care. Please return to the emergency department for any new or worsening symptoms.

## 2020-05-06 NOTE — ED Provider Notes (Signed)
[facility]-EMERGENCY DEPT Provider Note   CSN: [id number] Arrival date & time: 05/06/20  1057     History Chief Complaint  Patient presents with  . Sore Throat  . Nasal Congestion    [name] is a 25 y.o. female.  HPI   25 year old female with history of anxiety/depression, diabetes, headache, bipolar disorder, presents the emergency department today for evaluation of URI symptoms.  Reporting rhinorrhea, nasal congestion, postnasal drip, sore throat, cough, body aches for the last several days.  Negative rapid Covid test at home.  Requesting work note. Has been vaccinated for covid. Denies sick contacts.   Past Medical History:  Diagnosis Date  . Anxiety   . Depression   . Diabetes mellitus without complication (HCC)   . Headache   . Keratoconus of left eye   . Mental disorder   . Obesity     Patient Active Problem List   Diagnosis Date Noted  . Major depressive disorder, recurrent episode, severe (HCC) 03/10/2018  . Spontaneous vaginal delivery 09/18/2017  . Indication for care or intervention related to labor and delivery 09/17/2017  . Post-dates pregnancy 09/17/2017  . Abnormal urine odor 01/30/2017  . Back pain affecting pregnancy 01/30/2017  . Bipolar affective disorder, depressed, severe (HCC) 03/07/2011  . Attention-deficit hyperactivity disorder, combined type 03/07/2011  . Oppositional defiant disorder 03/07/2011  . Post traumatic stress disorder 03/07/2011    Past Surgical History:  Procedure Laterality Date  . CORNEAL TRANSPLANT  2016   per pt report  . WISDOM TOOTH EXTRACTION  Oct 2012   all four     OB History    Gravida  1   Para  1   Term  1   Preterm      AB      Living  1     SAB      IAB      Ectopic      Multiple  0   Live Births  1           Family History  Problem Relation Age of Onset  . Bipolar disorder Maternal Grandmother     Social History   Tobacco Use  . Smoking status:  Never Smoker  . Smokeless tobacco: Never Used  Vaping Use  . Vaping Use: Never used  Substance Use Topics  . Alcohol use: No  . Drug use: No    Home Medications Prior to Admission medications   Medication Sig Start Date End Date Taking? Authorizing Provider  benzonatate (TESSALON) 100 MG capsule Take 1 capsule (100 mg total) by mouth every 8 (eight) hours for 5 days. 05/06/20 05/11/20 Yes Litzy Dicker S, PA-C  phenol (CHLORASEPTIC MOUTH PAIN) 1.4 % LIQD Use as directed 1 spray in the mouth or throat as needed for throat irritation / pain. 05/06/20  Yes Bindi Klomp S, PA-C  buPROPion (WELLBUTRIN XL) 300 MG 24 hr tablet Take 1 tablet (300 mg total) by mouth daily. For mood 03/16/18   Aldean Baker, NP  hydrOXYzine (ATARAX/VISTARIL) 50 MG tablet Take 1 tablet (50 mg total) by mouth 3 (three) times daily as needed for anxiety (sleep). 03/15/18   Aldean Baker, NP  lamoTRIgine (LAMICTAL) 25 MG tablet Take 1 tablet (25 mg total) by mouth daily. For mood 03/16/18   Aldean Baker, NP  LORazepam (ATIVAN) 0.5 MG tablet Take 1 tablet (0.5 mg total) by mouth every 6 (six) hours as needed for anxiety or sleep. 03/15/18  Aldean Baker, NP  Melatonin 5 MG TABS Take 1 tablet (5 mg total) by mouth at bedtime. For sleep 03/15/18   Aldean Baker, NP  oseltamivir (TAMIFLU) 75 MG capsule Take 1 capsule (75 mg total) by mouth daily. For flu prevention 03/16/18   Aldean Baker, NP  prednisoLONE acetate (PRED FORTE) 1 % ophthalmic suspension Place 1 drop into the right eye daily. 12/30/17   [provider]  sertraline (ZOLOFT) 50 MG tablet Take 1 tablet (50 mg total) by mouth daily. For mood 03/16/18   Aldean Baker, NP  triamcinolone (KENALOG) 0.1 % Apply 1 application topically 2 (two) times daily. 03/20/20   Jeannie Fend, PA-C    Allergies    Shrimp [shellfish allergy] and Clindamycin/lincomycin  Review of Systems   Review of Systems  Constitutional: Negative for fever.  HENT: Positive for  congestion, postnasal drip, rhinorrhea and sore throat.   Eyes: Negative for visual disturbance.  Respiratory: Positive for cough. Negative for shortness of breath.   Cardiovascular: Negative for leg swelling.  Gastrointestinal: Negative for abdominal pain, diarrhea, nausea and vomiting.  Genitourinary: Negative for pelvic pain.  Musculoskeletal: Negative for back pain.    Physical Exam Updated Vital Signs BP (!) 141/92   Pulse 92   Temp 99.3 F (37.4 C)   Resp 18   LMP 04/22/2020   SpO2 99%   Physical Exam Vitals and nursing note reviewed.  Constitutional:      General: She is not in acute distress.    Appearance: She is well-developed.  HENT:     Head: Normocephalic and atraumatic.     Mouth/Throat:     Mouth: Mucous membranes are moist.     Pharynx: Uvula midline. No pharyngeal swelling, oropharyngeal exudate or posterior oropharyngeal erythema.     Tonsils: No tonsillar exudate or tonsillar abscesses. 0 on the right. 0 on the left.  Eyes:     Conjunctiva/sclera: Conjunctivae normal.  Cardiovascular:     Rate and Rhythm: Normal rate and regular rhythm.     Heart sounds: Normal heart sounds.  Pulmonary:     Effort: Pulmonary effort is normal.     Breath sounds: Normal breath sounds. No wheezing.  Abdominal:     Palpations: Abdomen is soft.     Tenderness: There is no abdominal tenderness.  Musculoskeletal:        General: Normal range of motion.     Cervical back: Neck supple.  Lymphadenopathy:     Cervical: No cervical adenopathy.  Skin:    General: Skin is warm and dry.  Neurological:     Mental Status: She is alert.     ED Results / Procedures / Treatments   Labs (all labs ordered are listed, but only abnormal results are displayed) Labs Reviewed  SARS CORONAVIRUS 2 (TAT 6-24 HRS)    EKG None  Radiology No results found.  Procedures Procedures   Medications Ordered in ED Medications - No data to display  ED Course  I have reviewed the  triage vital signs and the nursing notes.  Pertinent labs & imaging results that were available during my care of the patient were reviewed by me and considered in my medical decision making (see chart for details).    MDM Rules/Calculators/A&P                          25 y/o F presenting for URI sxs. centor score 0 therefore  doubt strep. Pt declines testing for this. Consents to covid test. Pending at discharge. Will f/u on mychart. Doubt deep space throat infection, pneumonia, or other pathology that would require further w/u or admission. Suspect viral URI. Symptomatic tx given. Work note given. Advised on f/u and return precautions. She voices understanding and is in agreement with plan. All questions answered, pt stable for discharge.    Final Clinical Impression(s) / ED Diagnoses Final diagnoses:  Viral URI with cough    Rx / DC Orders ED Discharge Orders         Ordered    benzonatate (TESSALON) 100 MG capsule  Every 8 hours        05/06/20 1151    phenol (CHLORASEPTIC MOUTH PAIN) 1.4 % LIQD  As needed        05/06/20 1151           Nickolas Chalfin S, PA-C 05/06/20 1152    Terald Sleeper, MD 05/06/20 [phone]

## 2020-05-07 LAB — SARS CORONAVIRUS 2 (TAT 6-24 HRS): SARS Coronavirus 2: NEGATIVE

## 2020-05-18 ENCOUNTER — Emergency Department (HOSPITAL_COMMUNITY)
Admission: EM | Admit: 2020-05-18 | Discharge: 2020-05-18 | Disposition: A | Discharge status: Home / Self Care | Payer: Medicaid Other | Attending: Emergency Medicine | Admitting: Emergency Medicine

## 2020-05-18 ENCOUNTER — Encounter (HOSPITAL_COMMUNITY): Payer: Self-pay | Admitting: Emergency Medicine

## 2020-05-18 ENCOUNTER — Emergency Department (HOSPITAL_COMMUNITY): Payer: Medicaid Other

## 2020-05-18 DIAGNOSIS — E119 Type 2 diabetes mellitus without complications: Secondary | ICD-10-CM | POA: Diagnosis not present

## 2020-05-18 DIAGNOSIS — G5603 Carpal tunnel syndrome, bilateral upper limbs: Secondary | ICD-10-CM | POA: Diagnosis not present

## 2020-05-18 DIAGNOSIS — M25531 Pain in right wrist: Secondary | ICD-10-CM | POA: Diagnosis present

## 2020-05-18 MED ORDER — KETOROLAC TROMETHAMINE 30 MG/ML IJ SOLN
30.0000 mg | Freq: Once | INTRAMUSCULAR | Status: AC
  Administered 2020-05-18: 30 mg via INTRAMUSCULAR
  Filled 2020-05-18: qty 1

## 2020-05-18 NOTE — ED Triage Notes (Signed)
Pt reports L wrist pain that it worse with movement. States that pain shoots down in to her fingers. States that it makes it difficult to work and even wipe herself.

## 2020-05-18 NOTE — ED Provider Notes (Signed)
[facility]-EMERGENCY DEPT Provider Note   CSN: [id number] Arrival date & time: 05/18/20  0202     History Chief Complaint  Patient presents with  . Wrist Pain    [name] is a 25 y.o. female with history of anxiety and depression who presents the emergency department with chief complaint of bilateral wrist pain.  The patient reports that she has been having intermittent bilateral wrist pain for some time, but reports constant pain in her left wrist for the last 3 days.  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  Pain will improve when she is not at work.  Pain has been waking her up at night and does feel somewhat improved temporarily when she shakes her wrist.  The pain will intermittently radiate up into her fingers or her upper arm.  She denies numbness, weakness, fever, chills, redness, warmth, or swelling.  She has been applying ice packs to the area with minimal improvement in her symptoms.  The history is provided by the patient and medical records. No language interpreter was used.       Past Medical History:  Diagnosis Date  . Anxiety   . Depression   . Diabetes mellitus without complication (HCC)   . Headache   . Keratoconus of left eye   . Mental disorder   . Obesity     Patient Active Problem List   Diagnosis Date Noted  . Major depressive disorder, recurrent episode, severe (HCC) 03/10/2018  . Spontaneous vaginal delivery 09/18/2017  . Indication for care or intervention related to labor and delivery 09/17/2017  . Post-dates pregnancy 09/17/2017  . Abnormal urine odor 01/30/2017  . Back pain affecting pregnancy 01/30/2017  . Bipolar affective disorder, depressed, severe (HCC) 03/07/2011  . Attention-deficit hyperactivity disorder, combined type 03/07/2011  . Oppositional defiant disorder 03/07/2011  . Post traumatic stress disorder  03/07/2011    Past Surgical History:  Procedure Laterality Date  . CORNEAL TRANSPLANT  2016   per pt report  . WISDOM TOOTH EXTRACTION  Oct 2012   all four     OB History    Gravida  1   Para  1   Term  1   Preterm      AB      Living  1     SAB      IAB      Ectopic      Multiple  0   Live Births  1           Family History  Problem Relation Age of Onset  . Bipolar disorder Maternal Grandmother     Social History   Tobacco Use  . Smoking status: Never Smoker  . Smokeless tobacco: Never Used  Vaping Use  . Vaping Use: Never used  Substance Use Topics  . Alcohol use: No  . Drug use: No    Home Medications Prior to Admission medications   Medication Sig Start Date End Date Taking? Authorizing Provider  buPROPion (WELLBUTRIN XL) 300 MG 24 hr tablet Take 1 tablet (300 mg total) by mouth daily. For mood 03/16/18   Aldean Baker, NP  hydrOXYzine (ATARAX/VISTARIL) 50 MG tablet Take 1 tablet (50 mg total) by mouth 3 (three) times daily as needed for anxiety (sleep). 03/15/18   Aldean Baker, NP  lamoTRIgine (LAMICTAL) 25 MG tablet Take 1 tablet (  25 mg total) by mouth daily. For mood 03/16/18   Aldean Baker, NP  LORazepam (ATIVAN) 0.5 MG tablet Take 1 tablet (0.5 mg total) by mouth every 6 (six) hours as needed for anxiety or sleep. 03/15/18   Aldean Baker, NP  Melatonin 5 MG TABS Take 1 tablet (5 mg total) by mouth at bedtime. For sleep 03/15/18   Aldean Baker, NP  oseltamivir (TAMIFLU) 75 MG capsule Take 1 capsule (75 mg total) by mouth daily. For flu prevention 03/16/18   Aldean Baker, NP  phenol (CHLORASEPTIC MOUTH PAIN) 1.4 % LIQD Use as directed 1 spray in the mouth or throat as needed for throat irritation / pain. 05/06/20   Couture, Cortni S, PA-C  prednisoLONE acetate (PRED FORTE) 1 % ophthalmic suspension Place 1 drop into the right eye daily. 12/30/17   [provider]  sertraline (ZOLOFT) 50 MG tablet Take 1 tablet (50 mg total) by  mouth daily. For mood 03/16/18   Aldean Baker, NP  triamcinolone (KENALOG) 0.1 % Apply 1 application topically 2 (two) times daily. 03/20/20   Jeannie Fend, PA-C    Allergies    Shrimp [shellfish allergy] and Clindamycin/lincomycin  Review of Systems   Review of Systems  Constitutional: Negative for activity change, chills and fever.  Respiratory: Negative for shortness of breath.   Cardiovascular: Negative for chest pain.  Gastrointestinal: Negative for abdominal pain, diarrhea, nausea and vomiting.  Genitourinary: Negative for dysuria.  Musculoskeletal: Positive for arthralgias and myalgias. Negative for back pain.  Skin: Negative for color change, rash and wound.  Allergic/Immunologic: Negative for immunocompromised state.  Neurological: Negative for dizziness, seizures, syncope, weakness, numbness and headaches.  Psychiatric/Behavioral: Negative for confusion.    Physical Exam Updated Vital Signs BP (!) 155/107 (BP Location: Left Arm) Comment: Triage RN aware  Pulse 89   Temp 98.8 F (37.1 C) (Oral)   Resp 18   LMP 04/22/2020   SpO2 100%   Physical Exam Vitals and nursing note reviewed.  Constitutional:      General: She is not in acute distress.    Appearance: She is not ill-appearing or diaphoretic.  HENT:     Head: Normocephalic.  Eyes:     Conjunctiva/sclera: Conjunctivae normal.  Cardiovascular:     Rate and Rhythm: Normal rate and regular rhythm.     Heart sounds: No murmur heard. No friction rub. No gallop.   Pulmonary:     Effort: Pulmonary effort is normal. No respiratory distress.  Abdominal:     General: There is no distension.     Palpations: Abdomen is soft.  Musculoskeletal:     Cervical back: Neck supple.     Comments: Full active and passive range of motion of the bilateral wrist and elbows.  Radial pulses are 2+ and symmetric.  Positive Tinel's sign.  Good capillary refill.  Sensation is intact and equal to the bilateral upper extremities.   No overlying erythema, edema, warmth, fluctuance, or induration.  Skin:    General: Skin is warm.     Findings: No rash.  Neurological:     Mental Status: She is alert.  Psychiatric:        Behavior: Behavior normal.     ED Results / Procedures / Treatments   Labs (all labs ordered are listed, but only abnormal results are displayed) Labs Reviewed - No data to display  EKG None  Radiology DG Wrist Complete Left  Result Date: 05/18/2020 CLINICAL DATA:  Left wrist pain and left hand pain. EXAM: LEFT WRIST - COMPLETE 3+ VIEW COMPARISON:  None. FINDINGS: There is no evidence of fracture or dislocation. There is no evidence of arthropathy or other focal bone abnormality. Soft tissues are unremarkable. IMPRESSION: Negative. Electronically Signed   By: Aram Candela M.D.   On: 05/18/2020 03:22   DG Hand Complete Left  Result Date: 05/18/2020 CLINICAL DATA:  Left wrist and left hand pain. EXAM: LEFT HAND - COMPLETE 3+ VIEW COMPARISON:  None. FINDINGS: There is no evidence of fracture or dislocation. There is no evidence of arthropathy or other focal bone abnormality. Soft tissues are unremarkable. IMPRESSION: Negative. Electronically Signed   By: Aram Candela M.D.   On: 05/18/2020 03:21    Procedures Procedures   Medications Ordered in ED Medications  ketorolac (TORADOL) 30 MG/ML injection 30 mg (has no administration in time range)    ED Course  I have reviewed the triage vital signs and the nursing notes.  Pertinent labs & imaging results that were available during my care of the patient were reviewed by me and considered in my medical decision making (see chart for details).    MDM Rules/Calculators/A&P                          24-year female with history of anxiety and depression who presents the emergency department with bilateral wrist pain that has been intermittent for some time, but left wrist pain became constant over the last 3 days.  Vital signs are normal.   She is neurovascular intact.  X-ray images obtained by triage that are negative for acute fracture or bony destruction on my evaluation.  Have a low suspicion for gout, septic arthritis, or occult fracture.  Suspect carpal tunnel syndrome given the patient's occupation.  We will give bilateral wrist braces and Toradol for pain control.  Discussed home RICE therapy.  She will follow-up with primary care if there is no improvement in her symptoms.  She is hemodynamically stable in no acute distress.  Stable discharge home with outpatient follow-up as needed.  Final Clinical Impression(s) / ED Diagnoses Final diagnoses:  Bilateral carpal tunnel syndrome    Rx / DC Orders ED Discharge Orders    None       Barkley Boards, PA-C 05/18/20 0456    Pollyann Savoy, MD 05/18/20 [phone]

## 2020-05-18 NOTE — Discharge Instructions (Signed)
Thank you for allowing me to care for you today in the Emergency Department.   Take 650 mg of Tylenol or 600 mg of ibuprofen with food every 6 hours for pain.  You can alternate between these 2 medications every 3 hours if your pain returns.  For instance, you can take Tylenol at noon, followed by a dose of ibuprofen at 3, followed by second dose of Tylenol and 6.  Wear the braces on your wrist at night and as frequently as possible to help improve your symptoms.  You can also apply ice packs for 15 to 20 minutes up to 3-4 times a day.  You can start performing exercises that will help to strengthen your wrist movement as your pain allows.  Return to the emergency department if you develop fever, chills, redness or swelling to the wrist, if your fingers turn blue, or if you develop other new, concerning symptoms.

## 2020-05-26 ENCOUNTER — Emergency Department (HOSPITAL_COMMUNITY)
Admission: EM | Admit: 2020-05-26 | Discharge: 2020-05-26 | Disposition: A | Discharge status: Home / Self Care | Payer: Medicaid Other | Attending: Emergency Medicine | Admitting: Emergency Medicine

## 2020-05-26 ENCOUNTER — Other Ambulatory Visit: Payer: Self-pay

## 2020-05-26 DIAGNOSIS — J069 Acute upper respiratory infection, unspecified: Secondary | ICD-10-CM | POA: Diagnosis not present

## 2020-05-26 DIAGNOSIS — Z20822 Contact with and (suspected) exposure to covid-19: Secondary | ICD-10-CM | POA: Insufficient documentation

## 2020-05-26 DIAGNOSIS — E119 Type 2 diabetes mellitus without complications: Secondary | ICD-10-CM | POA: Insufficient documentation

## 2020-05-26 DIAGNOSIS — R059 Cough, unspecified: Secondary | ICD-10-CM | POA: Diagnosis present

## 2020-05-26 LAB — RESP PANEL BY RT-PCR (FLU A&B, COVID) ARPGX2
Influenza A by PCR: POSITIVE — AB
Influenza B by PCR: NEGATIVE
SARS Coronavirus 2 by RT PCR: NEGATIVE

## 2020-05-26 MED ORDER — IBUPROFEN 200 MG PO TABS
600.0000 mg | ORAL_TABLET | Freq: Once | ORAL | Status: AC
  Administered 2020-05-26: 600 mg via ORAL
  Filled 2020-05-26: qty 3

## 2020-05-26 NOTE — ED Triage Notes (Addendum)
Patient reports she has had sweats, body aches, night sweats, chills, congestion x 2 days. Says her nose has begun to bleed also. Denies injury to nose. Pain rated 7/10

## 2020-05-26 NOTE — Discharge Instructions (Signed)
You were tested for COVID-19 and the Flu today, make sure to quarantine until you receive the results. If this is positive, please make sure to quarantine additional 7 to 10 days. You may take ibuprofen/Tylenol for body aches and fevers, drink plenty of fluids, over-the-counter cold and flu medications. You may also buy an over-the-counter pulse oximeter which can be found at your local pharmacy. If your oxygen saturation drops below 90%, please make sure to return to the ER. Return to the ER for any worsening shortness of breath.

## 2020-05-26 NOTE — ED Provider Notes (Signed)
[facility]-EMERGENCY DEPT Provider Note   CSN: [id number] Arrival date & time: 05/26/20  1538     History Chief Complaint  Patient presents with  . Generalized Body Aches    [name] is a 25 y.o. female.  HPI 25 year old female with history of DM type II, headache, depression, anxiety presents to the ER with complaints of URI symptoms.  Complains of nasal congestion, productive nonbloody cough, blood progression to body aches, night sweats, nasal congestion.  States she will notice some blood in her mucus when she blows her nose.  Denies any chest pain, shortness of breath.  She is vaccinated for COVID-19.  She was told to come to the ER by her boss to be evaluated since she had blood in her mucus    Past Medical History:  Diagnosis Date  . Anxiety   . Depression   . Diabetes mellitus without complication (HCC)   . Headache   . Keratoconus of left eye   . Mental disorder   . Obesity     Patient Active Problem List   Diagnosis Date Noted  . Major depressive disorder, recurrent episode, severe (HCC) 03/10/2018  . Spontaneous vaginal delivery 09/18/2017  . Indication for care or intervention related to labor and delivery 09/17/2017  . Post-dates pregnancy 09/17/2017  . Abnormal urine odor 01/30/2017  . Back pain affecting pregnancy 01/30/2017  . Bipolar affective disorder, depressed, severe (HCC) 03/07/2011  . Attention-deficit hyperactivity disorder, combined type 03/07/2011  . Oppositional defiant disorder 03/07/2011  . Post traumatic stress disorder 03/07/2011    Past Surgical History:  Procedure Laterality Date  . CORNEAL TRANSPLANT  2016   per pt report  . WISDOM TOOTH EXTRACTION  Oct 2012   all four     OB History    Gravida  1   Para  1   Term  1   Preterm      AB      Living  1     SAB      IAB      Ectopic      Multiple  0   Live Births  1           Family History  Problem Relation Age of Onset  .  Bipolar disorder Maternal Grandmother     Social History   Tobacco Use  . Smoking status: Never Smoker  . Smokeless tobacco: Never Used  Vaping Use  . Vaping Use: Never used  Substance Use Topics  . Alcohol use: No  . Drug use: No    Home Medications Prior to Admission medications   Medication Sig Start Date End Date Taking? Authorizing Provider  buPROPion (WELLBUTRIN XL) 300 MG 24 hr tablet Take 1 tablet (300 mg total) by mouth daily. For mood 03/16/18   Aldean Baker, NP  hydrOXYzine (ATARAX/VISTARIL) 50 MG tablet Take 1 tablet (50 mg total) by mouth 3 (three) times daily as needed for anxiety (sleep). 03/15/18   Aldean Baker, NP  lamoTRIgine (LAMICTAL) 25 MG tablet Take 1 tablet (25 mg total) by mouth daily. For mood 03/16/18   Aldean Baker, NP  LORazepam (ATIVAN) 0.5 MG tablet Take 1 tablet (0.5 mg total) by mouth every 6 (six) hours as needed for anxiety or sleep. 03/15/18   Aldean Baker, NP  Melatonin 5 MG TABS Take 1 tablet (5 mg total) by mouth at bedtime. For sleep 03/15/18   Aldean Baker, NP  oseltamivir (  TAMIFLU) 75 MG capsule Take 1 capsule (75 mg total) by mouth daily. For flu prevention 03/16/18   Aldean Baker, NP  phenol (CHLORASEPTIC MOUTH PAIN) 1.4 % LIQD Use as directed 1 spray in the mouth or throat as needed for throat irritation / pain. 05/06/20   Couture, Cortni S, PA-C  prednisoLONE acetate (PRED FORTE) 1 % ophthalmic suspension Place 1 drop into the right eye daily. 12/30/17   [provider]  sertraline (ZOLOFT) 50 MG tablet Take 1 tablet (50 mg total) by mouth daily. For mood 03/16/18   Aldean Baker, NP  triamcinolone (KENALOG) 0.1 % Apply 1 application topically 2 (two) times daily. 03/20/20   Jeannie Fend, PA-C    Allergies    Shrimp [shellfish allergy] and Clindamycin/lincomycin  Review of Systems   Review of Systems  Constitutional: Positive for activity change, appetite change, chills and fatigue. Negative for fever.  Respiratory:  Positive for cough. Negative for shortness of breath.   Cardiovascular: Negative for chest pain.  Gastrointestinal: Negative for abdominal pain, diarrhea, nausea and vomiting.  Genitourinary: Negative for dysuria.  Neurological: Positive for weakness.    Physical Exam Updated Vital Signs BP (!) 142/94 (BP Location: Left Arm)   Pulse 94   Temp 97.7 F (36.5 C) (Oral)   Resp 16   Ht 5\' 5"  (1.651 m)   Wt 116.2 kg   LMP 05/06/2020   SpO2 96%   BMI 42.62 kg/m   Physical Exam Vitals and nursing note reviewed.  Constitutional:      General: She is not in acute distress.    Appearance: She is well-developed.  HENT:     Head: Normocephalic and atraumatic.     Nose: Nose normal. No congestion or rhinorrhea.     Comments: No retained blood products  Eyes:     Conjunctiva/sclera: Conjunctivae normal.  Cardiovascular:     Rate and Rhythm: Normal rate and regular rhythm.     Heart sounds: No murmur heard.   Pulmonary:     Effort: Pulmonary effort is normal. No respiratory distress.     Breath sounds: Normal breath sounds.  Abdominal:     Palpations: Abdomen is soft.     Tenderness: There is no abdominal tenderness.  Musculoskeletal:     Cervical back: Neck supple.  Skin:    General: Skin is warm and dry.  Neurological:     General: No focal deficit present.     Mental Status: She is alert and oriented to person, place, and time.     ED Results / Procedures / Treatments   Labs (all labs ordered are listed, but only abnormal results are displayed) Labs Reviewed  RESPIRATORY PANEL BY PCR    EKG None  Radiology No results found.  Procedures Procedures   Medications Ordered in ED Medications  ibuprofen (ADVIL) tablet 600 mg (has no administration in time range)    ED Course  I have reviewed the triage vital signs and the nursing notes.  Pertinent labs & imaging results that were available during my care of the patient were reviewed by me and considered in my  medical decision making (see chart for details).    MDM Rules/Calculators/A&P                         Patients symptoms are consistent with URI, likely viral etiology. Consider COVID-19, Flu, tests sent. No known exposure. Patient is  vaccinated against COVID-19. Patient is  afebrile, tolerating secretions.  Lungs clear to auscultation bilaterally. Normal O2 saturation.Pt will be discharged with symptomatic treatment, home isolation precautions.  Return precautions discussed. Verbalizes understanding and is agreeable with plan. Pt is hemodynamically stable & in NAD prior to dc.   Final Clinical Impression(s) / ED Diagnoses Final diagnoses:  Upper respiratory tract infection, unspecified type    Rx / DC Orders ED Discharge Orders    None       Leone Brand 05/29/20 2139    Alvira Monday, MD 05/30/20 [phone]

## 2020-08-14 ENCOUNTER — Other Ambulatory Visit: Payer: Self-pay

## 2020-08-14 ENCOUNTER — Emergency Department (HOSPITAL_COMMUNITY): Payer: Medicaid Other

## 2020-08-14 ENCOUNTER — Emergency Department (HOSPITAL_COMMUNITY)
Admission: EM | Admit: 2020-08-14 | Discharge: 2020-08-14 | Disposition: A | Discharge status: Home / Self Care | Payer: Medicaid Other | Attending: Emergency Medicine | Admitting: Emergency Medicine

## 2020-08-14 ENCOUNTER — Encounter (HOSPITAL_COMMUNITY): Payer: Self-pay

## 2020-08-14 DIAGNOSIS — N898 Other specified noninflammatory disorders of vagina: Secondary | ICD-10-CM | POA: Insufficient documentation

## 2020-08-14 DIAGNOSIS — R110 Nausea: Secondary | ICD-10-CM | POA: Insufficient documentation

## 2020-08-14 DIAGNOSIS — R0789 Other chest pain: Secondary | ICD-10-CM | POA: Diagnosis not present

## 2020-08-14 DIAGNOSIS — E119 Type 2 diabetes mellitus without complications: Secondary | ICD-10-CM | POA: Diagnosis not present

## 2020-08-14 DIAGNOSIS — R1030 Lower abdominal pain, unspecified: Secondary | ICD-10-CM | POA: Insufficient documentation

## 2020-08-14 DIAGNOSIS — R197 Diarrhea, unspecified: Secondary | ICD-10-CM | POA: Insufficient documentation

## 2020-08-14 LAB — COMPREHENSIVE METABOLIC PANEL
ALT: 33 U/L (ref 0–44)
AST: 24 U/L (ref 15–41)
Albumin: 4 g/dL (ref 3.5–5.0)
Alkaline Phosphatase: 74 U/L (ref 38–126)
Anion gap: 7 (ref 5–15)
BUN: 13 mg/dL (ref 6–20)
CO2: 23 mmol/L (ref 22–32)
Calcium: 9.3 mg/dL (ref 8.9–10.3)
Chloride: 107 mmol/L (ref 98–111)
Creatinine, Ser: 0.62 mg/dL (ref 0.44–1.00)
GFR, Estimated: >60 mL/min (ref >60)
Glucose, Bld: 149 mg/dL — ABNORMAL HIGH (ref 70–99)
Potassium: 3.8 mmol/L (ref 3.5–5.1)
Sodium: 137 mmol/L (ref 135–145)
Total Bilirubin: 0.6 mg/dL (ref 0.3–1.2)
Total Protein: 7.2 g/dL (ref 6.5–8.1)

## 2020-08-14 LAB — CBC WITH DIFFERENTIAL/PLATELET
Abs Immature Granulocytes: 0.06 10*3/uL (ref 0.00–0.07)
Basophils Absolute: 0.1 10*3/uL (ref 0.0–0.1)
Basophils Relative: 1 %
Eosinophils Absolute: 0.5 10*3/uL (ref 0.0–0.5)
Eosinophils Relative: 5 %
HCT: 39.9 % (ref 36.0–46.0)
Hemoglobin: 13.3 g/dL (ref 12.0–15.0)
Immature Granulocytes: 1 %
Lymphocytes Relative: 24 %
Lymphs Abs: 2.3 10*3/uL (ref 0.7–4.0)
MCH: 29.6 pg (ref 26.0–34.0)
MCHC: 33.3 g/dL (ref 30.0–36.0)
MCV: 88.9 fL (ref 80.0–100.0)
Monocytes Absolute: 0.6 10*3/uL (ref 0.1–1.0)
Monocytes Relative: 6 %
Neutro Abs: 6.3 10*3/uL (ref 1.7–7.7)
Neutrophils Relative %: 63 %
Platelets: 399 10*3/uL (ref 150–400)
RBC: 4.49 MIL/uL (ref 3.87–5.11)
RDW: 13.7 % (ref 11.5–15.5)
WBC: 9.8 10*3/uL (ref 4.0–10.5)
nRBC: 0 % (ref 0.0–0.2)

## 2020-08-14 LAB — TROPONIN I (HIGH SENSITIVITY): Troponin I (High Sensitivity): <2 ng/L (ref <18)

## 2020-08-14 LAB — I-STAT BETA HCG BLOOD, ED (MC, WL, AP ONLY): I-stat hCG, quantitative: <5 m[IU]/mL (ref <5)

## 2020-08-14 LAB — CBG MONITORING, ED: Glucose-Capillary: 155 mg/dL — ABNORMAL HIGH (ref 70–99)

## 2020-08-14 MED ORDER — KETOROLAC TROMETHAMINE 30 MG/ML IJ SOLN
30.0000 mg | Freq: Once | INTRAMUSCULAR | Status: AC
  Administered 2020-08-14: 30 mg via INTRAMUSCULAR
  Filled 2020-08-14: qty 1

## 2020-08-14 MED ORDER — ONDANSETRON 8 MG PO TBDP
8.0000 mg | ORAL_TABLET | Freq: Once | ORAL | Status: AC
  Administered 2020-08-14: 8 mg via ORAL
  Filled 2020-08-14: qty 1

## 2020-08-14 MED ORDER — ACETAMINOPHEN 325 MG PO TABS
650.0000 mg | ORAL_TABLET | Freq: Once | ORAL | Status: AC
  Administered 2020-08-14: 650 mg via ORAL
  Filled 2020-08-14: qty 2

## 2020-08-14 NOTE — ED Provider Notes (Addendum)
Emergency Medicine Provider Triage Evaluation Note  [name] , a 25 y.o. female  was evaluated in triage.  Pt complains of nausea that started this morning.  She states that she has associated left-sided chest tightness, shortness of breath, upper abdominal pain when retching, intermittent diarrhea, as well as lightheadedness.  Also reports worsening "nerve pain" in both legs.  She states she was recently diagnosed with diabetes mellitus 3 months ago and was started on metformin which she has been compliant with.  No other complaints.  Physical Exam  BP (!) 130/91 (BP Location: Left Arm)   Pulse 91   Temp 99 F (37.2 C) (Oral)   Resp 16   Ht 5\' 5"  (1.651 m)   Wt 120.7 kg   SpO2 97%   BMI 44.26 kg/m  Gen:   Awake, no distress   Resp:  Normal effort  MSK:   Moves extremities without difficulty  Other:    Medical Decision Making  Medically screening exam initiated at 4:17 PM.  Appropriate orders placed.  LURINE IMEL was informed that the remainder of the evaluation will be completed by another provider, this initial triage assessment does not replace that evaluation, and the importance of remaining in the ED until their evaluation is complete.   Lynnell Catalan, PA-C 08/14/20 1619    10/15/20, PA-C 08/14/20 1653    10/15/20, MD 08/15/20 [phone]

## 2020-08-14 NOTE — ED Provider Notes (Signed)
[facility]-EMERGENCY DEPT Provider Note   CSN: [id number] Arrival date & time: 08/14/20  1602     History Chief Complaint  Patient presents with   Chest Pain   Leg Pain   Nausea    [name] is a 25 y.o. female with a past medical history of diabetes, depression presenting to the ED with a chief complaint of nausea, left-sided chest pain, lower abdominal pain, diarrhea.  Symptoms began about 2 days ago.  Reports intermittent left-sided chest pain without specific aggravating or alleviating factor.  This improves with ibuprofen.  She reports lower pelvic pain but denies any vaginal discharge, abnormal bleeding or urinary symptoms.  Has had diarrhea for the past few weeks but this has not worsened and is intermittent.  Reports "nerve pain" in both of her legs that has been going on since she was diagnosed with diabetes.  Denies any leg pain or swelling.  No shortness of breath or pleuritic chest pain.  No history of DVT or PE, MI, recent immobilization.  States that she was sent to the ER from work due to her symptoms.  HPI     Past Medical History:  Diagnosis Date   Anxiety    Depression    Diabetes mellitus without complication (HCC)    Headache    Keratoconus of left eye    Mental disorder    Obesity     Patient Active Problem List   Diagnosis Date Noted   Major depressive disorder, recurrent episode, severe (HCC) 03/10/2018   Spontaneous vaginal delivery 09/18/2017   Indication for care or intervention related to labor and delivery 09/17/2017   Post-dates pregnancy 09/17/2017   Abnormal urine odor 01/30/2017   Back pain affecting pregnancy 01/30/2017   Bipolar affective disorder, depressed, severe (HCC) 03/07/2011   Attention-deficit hyperactivity disorder, combined type 03/07/2011   Oppositional defiant disorder 03/07/2011   Post traumatic stress disorder 03/07/2011    Past Surgical History:  Procedure Laterality Date   CORNEAL  TRANSPLANT  2016   per pt report   WISDOM TOOTH EXTRACTION  Oct 2012   all four     OB History     Gravida  1   Para  1   Term  1   Preterm      AB      Living  1      SAB      IAB      Ectopic      Multiple  0   Live Births  1           Family History  Problem Relation Age of Onset   COPD Mother    Depression Mother    Carpal tunnel syndrome Father    Diabetes Brother    Bipolar disorder Maternal Grandmother     Social History   Tobacco Use   Smoking status: Never   Smokeless tobacco: Never  Vaping Use   Vaping Use: Never used  Substance Use Topics   Alcohol use: No   Drug use: No    Home Medications Prior to Admission medications   Medication Sig Start Date End Date Taking? Authorizing Provider  buPROPion (WELLBUTRIN XL) 300 MG 24 hr tablet Take 1 tablet (300 mg total) by mouth daily. For mood 03/16/18   Aldean Baker, NP  hydrOXYzine (ATARAX/VISTARIL) 50 MG tablet Take 1 tablet (50 mg total) by mouth 3 (three) times daily as needed for anxiety (sleep). 03/15/18   Gerilyn Pilgrim,  Marcelyn Ditty, NP  lamoTRIgine (LAMICTAL) 25 MG tablet Take 1 tablet (25 mg total) by mouth daily. For mood 03/16/18   Aldean Baker, NP  LORazepam (ATIVAN) 0.5 MG tablet Take 1 tablet (0.5 mg total) by mouth every 6 (six) hours as needed for anxiety or sleep. 03/15/18   Aldean Baker, NP  Melatonin 5 MG TABS Take 1 tablet (5 mg total) by mouth at bedtime. For sleep 03/15/18   Aldean Baker, NP  oseltamivir (TAMIFLU) 75 MG capsule Take 1 capsule (75 mg total) by mouth daily. For flu prevention 03/16/18   Aldean Baker, NP  phenol (CHLORASEPTIC MOUTH PAIN) 1.4 % LIQD Use as directed 1 spray in the mouth or throat as needed for throat irritation / pain. 05/06/20   Couture, Cortni S, PA-C  prednisoLONE acetate (PRED FORTE) 1 % ophthalmic suspension Place 1 drop into the right eye daily. 12/30/17   [provider]  sertraline (ZOLOFT) 50 MG tablet Take 1 tablet (50 mg total) by mouth  daily. For mood 03/16/18   Aldean Baker, NP  triamcinolone (KENALOG) 0.1 % Apply 1 application topically 2 (two) times daily. 03/20/20   Jeannie Fend, PA-C    Allergies    Shrimp [shellfish allergy], Amoxicillin, and Clindamycin/lincomycin  Review of Systems   Review of Systems  Constitutional:  Negative for appetite change, chills and fever.  HENT:  Negative for ear pain, rhinorrhea, sneezing and sore throat.   Eyes:  Negative for photophobia and visual disturbance.  Respiratory:  Negative for cough, chest tightness, shortness of breath and wheezing.   Cardiovascular:  Positive for chest pain. Negative for palpitations.  Gastrointestinal:  Positive for abdominal pain, diarrhea and nausea. Negative for blood in stool, constipation and vomiting.  Genitourinary:  Negative for dysuria, hematuria and urgency.  Musculoskeletal:  Negative for myalgias.  Skin:  Negative for rash.  Neurological:  Negative for dizziness, weakness and light-headedness.   Physical Exam Updated Vital Signs BP (!) 140/91 (BP Location: Left Arm)   Pulse 89   Temp 99 F (37.2 C) (Oral)   Resp (!) 22   Ht 5\' 5"  (1.651 m)   Wt 120.7 kg   SpO2 99%   BMI 44.26 kg/m   Physical Exam Vitals and nursing note reviewed.  Constitutional:      General: She is not in acute distress.    Appearance: She is well-developed.     Comments: Speaking in complete sentences without difficulty.  No signs of distress  HENT:     Head: Normocephalic and atraumatic.     Nose: Nose normal.  Eyes:     General: No scleral icterus.       Left eye: No discharge.     Conjunctiva/sclera: Conjunctivae normal.  Cardiovascular:     Rate and Rhythm: Normal rate and regular rhythm.     Heart sounds: Normal heart sounds. No murmur heard.   No friction rub. No gallop.  Pulmonary:     Effort: Pulmonary effort is normal. No respiratory distress.     Breath sounds: Normal breath sounds.  Abdominal:     General: Bowel sounds are normal.  There is no distension.     Palpations: Abdomen is soft.     Tenderness: There is no abdominal tenderness. There is no guarding.     Comments: Abdomen is soft, nontender nondistended.  Musculoskeletal:        General: Normal range of motion.     Cervical back: Normal  range of motion and neck supple.     Comments: No lower extremity edema, erythema or calf tenderness bilaterally.  2+ DP pulse palpated bilaterally.  Skin:    General: Skin is warm and dry.     Findings: No rash.  Neurological:     Mental Status: She is alert.     Motor: No abnormal muscle tone.     Coordination: Coordination normal.    ED Results / Procedures / Treatments   Labs (all labs ordered are listed, but only abnormal results are displayed) Labs Reviewed  COMPREHENSIVE METABOLIC PANEL - Abnormal; Notable for the following components:      Result Value   Glucose, Bld 149 (*)    All other components within normal limits  CBG MONITORING, ED - Abnormal; Notable for the following components:   Glucose-Capillary 155 (*)    All other components within normal limits  CBC WITH DIFFERENTIAL/PLATELET  URINALYSIS, ROUTINE W REFLEX MICROSCOPIC  I-STAT BETA HCG BLOOD, ED ([facility], [facility], [facility] ONLY)  CBG MONITORING, ED  TROPONIN I (HIGH SENSITIVITY)    EKG EKG Interpretation  Date/Time:  Tuesday August 14 2020 16:26:02 EDT Ventricular Rate:  87 PR Interval:  136 QRS Duration: 78 QT Interval:  356 QTC Calculation: 428 R Axis:   11 Text Interpretation: Normal sinus rhythm Normal ECG No old tracing to compare Confirmed by Linwood DibblesKnapp, Jon [phone]([id number]) on 08/14/2020 4:34:09 PM  Radiology DG Chest Portable 1 View  Result Date: 08/14/2020 CLINICAL DATA:  25 year old female with chest pain and shortness of breath. EXAM: PORTABLE CHEST 1 VIEW COMPARISON:  Chest radiograph dated 04/04/2015 FINDINGS: The heart size and mediastinal contours are within normal limits. Both lungs are clear. The visualized skeletal structures are unremarkable.  IMPRESSION: No active disease. Electronically Signed   By: Elgie CollardArash  Radparvar M.D.   On: 08/14/2020 19:03    Procedures Procedures   Medications Ordered in ED Medications  ondansetron (ZOFRAN-ODT) disintegrating tablet 8 mg (8 mg Oral Given 08/14/20 1633)  ketorolac (TORADOL) 30 MG/ML injection 30 mg (30 mg Intramuscular Given 08/14/20 2011)  acetaminophen (TYLENOL) tablet 650 mg (650 mg Oral Given 08/14/20 2011)    ED Course  I have reviewed the triage vital signs and the nursing notes.  Pertinent labs & imaging results that were available during my care of the patient were reviewed by me and considered in my medical decision making (see chart for details).  Clinical Course as of 08/14/20 2059  Tue Aug 14, 2020  1926 Troponin I (High Sensitivity): <2 [HK]  1926 WBC: 9.8 [HK]  1926 Creatinine: 0.62 [HK]  1926 DG Chest Portable 1 View Negative [HK]    Clinical Course User Index [HK] Dietrich PatesKhatri, Dallys Nowakowski, PA-C   MDM Rules/Calculators/A&P                          25 year old female presenting to the ED for multiple complaints.  Reports intermittent nausea, diarrhea, pelvic pain, left-sided chest pain, lightheadedness.  Symptoms got worse today while at work.  She is a non-insulin-dependent diabetic and has been having nerve pain both of her legs since being diagnosed with diabetes.  Her symptoms improved with ibuprofen at home.  On exam patient abdomen is soft and nontender.  No signs of respiratory distress.  No lower extremity edema, erythema or calf tenderness is concerning for DVT.  Vital signs within normal limits.  Troponin is negative x1.  EKG shows normal sinus rhythm, no ischemic changes, no STEMI.  CBC, CMP unremarkable.  Chest x-ray without any acute findings.  hCG is negative.  Patient given Zofran here with improvement in her nausea.  Improvement with Toradol and Tylenol.  On recheck she is resting comfortably.  She is not tachycardic or hypoxic.  I suspect that her chest pain could be  musculoskeletal in nature.  In regards to her likely neuropathy related to her diabetes we will have her follow-up with your PCP.  I doubt ACS, PE as the cause of her chest pain.  No structural cause seen on imaging.  Abdomen is soft so I doubt appendicitis, cholecystitis or other surgical emergent cause of her symptoms.  We will have her take NSAIDs as needed for discomfort.  Tolerating p.o. intake here.  She is requesting discharge.  Return precautions given.   Patient is hemodynamically stable, in NAD, and able to ambulate in the ED. Evaluation does not show pathology that would require ongoing emergent intervention or inpatient treatment. I explained the diagnosis to the patient. Pain has been managed and has no complaints prior to discharge. Patient is comfortable with above plan and is stable for discharge at this time. All questions were answered prior to disposition. Strict return precautions for returning to the ED were discussed. Encouraged follow up with PCP.   An After Visit Summary was printed and given to the patient.   Portions of this note were generated with Scientist, clinical (histocompatibility and immunogenetics). Dictation errors may occur despite best attempts at proofreading.  Final Clinical Impression(s) / ED Diagnoses Final diagnoses:  Chest wall pain  Nausea    Rx / DC Orders ED Discharge Orders     None        Dietrich Pates, PA-C 08/14/20 2100    Koleen Distance, MD 08/14/20 2322

## 2020-08-14 NOTE — ED Notes (Signed)
Discharged no concerns at this time.  °

## 2020-08-14 NOTE — Discharge Instructions (Addendum)
Continue home medications as previously prescribed. Follow-up with your primary care provider regarding your nerve pain. Return to the ER if you start to experience worsening chest pain, shortness of breath, abdominal pain, vomiting.

## 2020-08-14 NOTE — ED Triage Notes (Signed)
Patient c/o intermittent left chest pain since yesterday, but worse today. Patient also c/o SOB and nausea.  Patient added that she has bilateral lower extremity pain from knees to her feet x 2 weeks.

## 2020-09-25 ENCOUNTER — Emergency Department (HOSPITAL_COMMUNITY): Payer: Medicaid Other

## 2020-09-25 ENCOUNTER — Other Ambulatory Visit: Payer: Self-pay

## 2020-09-25 ENCOUNTER — Emergency Department (HOSPITAL_COMMUNITY)
Admission: EM | Admit: 2020-09-25 | Discharge: 2020-09-25 | Disposition: A | Discharge status: Home / Self Care | Payer: Medicaid Other | Attending: Emergency Medicine | Admitting: Emergency Medicine

## 2020-09-25 ENCOUNTER — Encounter (HOSPITAL_COMMUNITY): Payer: Self-pay

## 2020-09-25 DIAGNOSIS — J069 Acute upper respiratory infection, unspecified: Secondary | ICD-10-CM | POA: Diagnosis not present

## 2020-09-25 DIAGNOSIS — R0602 Shortness of breath: Secondary | ICD-10-CM

## 2020-09-25 DIAGNOSIS — E1169 Type 2 diabetes mellitus with other specified complication: Secondary | ICD-10-CM | POA: Insufficient documentation

## 2020-09-25 DIAGNOSIS — Z7984 Long term (current) use of oral hypoglycemic drugs: Secondary | ICD-10-CM | POA: Insufficient documentation

## 2020-09-25 DIAGNOSIS — Z79899 Other long term (current) drug therapy: Secondary | ICD-10-CM | POA: Diagnosis not present

## 2020-09-25 DIAGNOSIS — Z20822 Contact with and (suspected) exposure to covid-19: Secondary | ICD-10-CM | POA: Insufficient documentation

## 2020-09-25 DIAGNOSIS — E669 Obesity, unspecified: Secondary | ICD-10-CM | POA: Insufficient documentation

## 2020-09-25 DIAGNOSIS — R079 Chest pain, unspecified: Secondary | ICD-10-CM | POA: Insufficient documentation

## 2020-09-25 DIAGNOSIS — N9489 Other specified conditions associated with female genital organs and menstrual cycle: Secondary | ICD-10-CM | POA: Diagnosis not present

## 2020-09-25 DIAGNOSIS — R059 Cough, unspecified: Secondary | ICD-10-CM

## 2020-09-25 LAB — RESP PANEL BY RT-PCR (FLU A&B, COVID) ARPGX2
Influenza A by PCR: NEGATIVE
Influenza B by PCR: NEGATIVE
SARS Coronavirus 2 by RT PCR: NEGATIVE

## 2020-09-25 LAB — BASIC METABOLIC PANEL
Anion gap: 5 (ref 5–15)
BUN: 7 mg/dL (ref 6–20)
CO2: 25 mmol/L (ref 22–32)
Calcium: 9.1 mg/dL (ref 8.9–10.3)
Chloride: 105 mmol/L (ref 98–111)
Creatinine, Ser: 0.5 mg/dL (ref 0.44–1.00)
GFR, Estimated: >60 mL/min (ref >60)
Glucose, Bld: 131 mg/dL — ABNORMAL HIGH (ref 70–99)
Potassium: 3.6 mmol/L (ref 3.5–5.1)
Sodium: 135 mmol/L (ref 135–145)

## 2020-09-25 LAB — TROPONIN I (HIGH SENSITIVITY)
Troponin I (High Sensitivity): <2 ng/L (ref <18)
Troponin I (High Sensitivity): <2 ng/L (ref <18)

## 2020-09-25 LAB — CBC
HCT: 39.8 % (ref 36.0–46.0)
Hemoglobin: 13.2 g/dL (ref 12.0–15.0)
MCH: 29.3 pg (ref 26.0–34.0)
MCHC: 33.2 g/dL (ref 30.0–36.0)
MCV: 88.4 fL (ref 80.0–100.0)
Platelets: 350 10*3/uL (ref 150–400)
RBC: 4.5 MIL/uL (ref 3.87–5.11)
RDW: 13.2 % (ref 11.5–15.5)
WBC: 8.5 10*3/uL (ref 4.0–10.5)
nRBC: 0 % (ref 0.0–0.2)

## 2020-09-25 LAB — I-STAT BETA HCG BLOOD, ED (MC, WL, AP ONLY): I-stat hCG, quantitative: <5 m[IU]/mL (ref <5)

## 2020-09-25 MED ORDER — ALUM & MAG HYDROXIDE-SIMETH 200-200-20 MG/5ML PO SUSP
30.0000 mL | Freq: Once | ORAL | Status: AC
  Administered 2020-09-25: 30 mL via ORAL
  Filled 2020-09-25: qty 30

## 2020-09-25 MED ORDER — ACETAMINOPHEN 500 MG PO TABS
1000.0000 mg | ORAL_TABLET | Freq: Once | ORAL | Status: AC
  Administered 2020-09-25: 1000 mg via ORAL
  Filled 2020-09-25: qty 2

## 2020-09-25 MED ORDER — METHOCARBAMOL 500 MG PO TABS: 500.0000 mg | ORAL_TABLET | Freq: Two times a day (BID) | ORAL | 0 refills | Status: DC

## 2020-09-25 MED ORDER — ONDANSETRON 4 MG PO TBDP: 4.0000 mg | ORAL_TABLET | Freq: Three times a day (TID) | ORAL | 0 refills | Status: DC | PRN

## 2020-09-25 MED ORDER — LIDOCAINE VISCOUS HCL 2 % MT SOLN
15.0000 mL | Freq: Once | OROMUCOSAL | Status: AC
  Administered 2020-09-25: 15 mL via ORAL
  Filled 2020-09-25: qty 15

## 2020-09-25 NOTE — ED Provider Notes (Signed)
[facility]-EMERGENCY DEPT Provider Note   CSN: [id number] Arrival date & time: 09/25/20  0840     History Chief Complaint  Patient presents with   Covid Exposure   Chest Pain   Sore Throat   Nasal Congestion   Generalized Body Aches    [name] is a 25 y.o. female.  HPI Patient is a 25 year old female with past medical history significant for anxiety, depression, DM2, obesity, mental disorder  Patient is presented to the emergency room today with complaints of chest congestion, cough, chest pain worse with coughing, nasal congestion, sore throat, short of breath, fatigue, diffuse myalgias she states her symptoms have been going on for approximately 3 days now.  She denies any fevers or chills.  Denies any lightheadedness or dizziness.  States that she hurts from head to toe.  States her pain is 10/10.  Denies any radiation of her chest pain denies any exertional or pleuritic component.  No hemoptysis.  No recent surgeries, hospitalization, long travel, hemoptysis, estrogen containing OCP, cancer history.  No unilateral leg swelling.  No history of PE or VTE.     Past Medical History:  Diagnosis Date   Anxiety    Depression    Diabetes mellitus without complication (HCC)    Headache    Keratoconus of left eye    Mental disorder    Obesity     Patient Active Problem List   Diagnosis Date Noted   Major depressive disorder, recurrent episode, severe (HCC) 03/10/2018   Spontaneous vaginal delivery 09/18/2017   Indication for care or intervention related to labor and delivery 09/17/2017   Post-dates pregnancy 09/17/2017   Abnormal urine odor 01/30/2017   Back pain affecting pregnancy 01/30/2017   Bipolar affective disorder, depressed, severe (HCC) 03/07/2011   Attention-deficit hyperactivity disorder, combined type 03/07/2011   Oppositional defiant disorder 03/07/2011   Post traumatic stress disorder 03/07/2011    Past Surgical History:   Procedure Laterality Date   CORNEAL TRANSPLANT  2016   per pt report   WISDOM TOOTH EXTRACTION  Oct 2012   all four     OB History     Gravida  1   Para  1   Term  1   Preterm      AB      Living  1      SAB      IAB      Ectopic      Multiple  0   Live Births  1           Family History  Problem Relation Age of Onset   COPD Mother    Depression Mother    Carpal tunnel syndrome Father    Diabetes Brother    Bipolar disorder Maternal Grandmother     Social History   Tobacco Use   Smoking status: Never   Smokeless tobacco: Never  Vaping Use   Vaping Use: Never used  Substance Use Topics   Alcohol use: No   Drug use: No    Home Medications Prior to Admission medications   Medication Sig Start Date End Date Taking? Authorizing Provider  buPROPion (WELLBUTRIN XL) 300 MG 24 hr tablet Take 1 tablet (300 mg total) by mouth daily. For mood 03/16/18  Yes Aldean Baker, NP  hydrOXYzine (ATARAX/VISTARIL) 50 MG tablet Take 1 tablet (50 mg total) by mouth 3 (three) times daily as needed for anxiety (sleep). 03/15/18  Yes Aldean Baker, NP  ibuprofen (ADVIL) 200 MG tablet Take 600-800 mg by mouth every 6 (six) hours as needed for headache or mild pain.   Yes [provider]  Melatonin 5 MG TABS Take 1 tablet (5 mg total) by mouth at bedtime. For sleep Patient taking differently: Take 5 mg by mouth at bedtime as needed (sleep). 03/15/18  Yes Aldean BakerSykes, Janet E, NP  metFORMIN (GLUCOPHAGE) 500 MG tablet Take 500 mg by mouth 2 (two) times daily. 06/09/20  Yes [provider]  methocarbamol (ROBAXIN) 500 MG tablet Take 1 tablet (500 mg total) by mouth 2 (two) times daily. 09/25/20  Yes Tishawn Friedhoff S, PA  ondansetron (ZOFRAN ODT) 4 MG disintegrating tablet Take 1 tablet (4 mg total) by mouth every 8 (eight) hours as needed for nausea or vomiting. 09/25/20  Yes Jady Braggs S, PA  prednisoLONE acetate (PRED FORTE) 1 % ophthalmic suspension Place 1 drop  into the right eye daily. 12/30/17  Yes [provider]  sertraline (ZOLOFT) 50 MG tablet Take 1 tablet (50 mg total) by mouth daily. For mood Patient taking differently: Take 50 mg by mouth daily. 03/16/18  Yes Aldean BakerSykes, Janet E, NP  triamcinolone (KENALOG) 0.1 % Apply 1 application topically 2 (two) times daily. Patient taking differently: Apply 1 application topically 2 (two) times daily as needed (dry/itchy skin). 03/20/20  Yes Jeannie FendMurphy, Laura A, PA-C    Allergies    Shrimp [shellfish allergy], Amoxicillin, Clindamycin/lincomycin, and Doxycycline  Review of Systems   Review of Systems  Constitutional:  Positive for fatigue. Negative for chills and fever.  HENT:  Positive for congestion.   Eyes:  Negative for pain.  Respiratory:  Positive for cough and shortness of breath.   Cardiovascular:  Positive for chest pain. Negative for leg swelling.  Gastrointestinal:  Positive for nausea. Negative for abdominal pain and vomiting.  Endocrine: Negative for polydipsia.  Genitourinary:  Negative for dysuria.  Musculoskeletal:  Positive for myalgias.  Skin:  Negative for rash.  Neurological:  Negative for dizziness and headaches.  Psychiatric/Behavioral:  Negative for suicidal ideas.    Physical Exam Updated Vital Signs BP 121/64   Pulse 92   Temp 98.8 F (37.1 C) (Oral)   Resp 20   Ht 5\' 5"  (1.651 m)   Wt 97.5 kg   LMP 09/18/2020 (Approximate)   SpO2 98%   BMI 35.78 kg/m   Physical Exam Vitals and nursing note reviewed.  Constitutional:      General: She is not in acute distress.    Comments: Pleasant well-appearing 25 year old.  In no acute distress.  Sitting comfortably in bed.  Able answer questions appropriately follow commands. No increased work of breathing. Speaking in full sentences.   HENT:     Head: Normocephalic and atraumatic.     Nose: Nose normal.     Mouth/Throat:     Comments: Posterior pharynx is moist.  There is no significant posterior pharynx erythema.   Tonsils are minimal and symmetric uvula is midline. Eyes:     General: No scleral icterus. Cardiovascular:     Rate and Rhythm: Normal rate and regular rhythm.     Pulses: Normal pulses.     Heart sounds: Normal heart sounds.  Pulmonary:     Effort: Pulmonary effort is normal. No respiratory distress.     Breath sounds: No wheezing.  Abdominal:     Palpations: Abdomen is soft.     Tenderness: There is no abdominal tenderness.  Musculoskeletal:     Cervical back:  Normal range of motion.     Right lower leg: No edema.     Left lower leg: No edema.     Comments: No lower extremity edema or calf tenderness  Skin:    General: Skin is warm and dry.     Capillary Refill: Capillary refill takes less than 2 seconds.  Neurological:     Mental Status: She is alert. Mental status is at baseline.  Psychiatric:        Mood and Affect: Mood normal.        Behavior: Behavior normal.    ED Results / Procedures / Treatments   Labs (all labs ordered are listed, but only abnormal results are displayed) Labs Reviewed  BASIC METABOLIC PANEL - Abnormal; Notable for the following components:      Result Value   Glucose, Bld 131 (*)    All other components within normal limits  RESP PANEL BY RT-PCR (FLU A&B, COVID) ARPGX2  CBC  I-STAT BETA HCG BLOOD, ED ([facility], [facility], [facility] ONLY)  TROPONIN I (HIGH SENSITIVITY)  TROPONIN I (HIGH SENSITIVITY)    EKG None  Radiology DG Chest 2 View  Result Date: 09/25/2020 CLINICAL DATA:  Chest pain EXAM: CHEST - 2 VIEW COMPARISON:  08/14/2020 FINDINGS: The heart size and mediastinal contours are within normal limits.No focal airspace disease. No pleural effusion or pneumothorax.No acute osseous abnormality. IMPRESSION: No evidence of acute cardiopulmonary disease. Electronically Signed   By: Caprice Renshaw M.D.   On: 09/25/2020 10:28    Procedures Procedures   Medications Ordered in ED Medications  acetaminophen (TYLENOL) tablet 1,000 mg (1,000 mg Oral Given 09/25/20  1147)  alum & mag hydroxide-simeth (MAALOX/MYLANTA) 200-200-20 MG/5ML suspension 30 mL (30 mLs Oral Given 09/25/20 1147)    And  lidocaine (XYLOCAINE) 2 % viscous mouth solution 15 mL (15 mLs Oral Given 09/25/20 1147)    ED Course  I have reviewed the triage vital signs and the nursing notes.  Pertinent labs & imaging results that were available during my care of the patient were reviewed by me and considered in my medical decision making (see chart for details).    MDM Rules/Calculators/A&P                           Patient is a 25 year old female presented today with complaints discussed in HPI consistent with URI they have been ongoing for 2 days.  She did endorse some chest tightness and discomfort and therefore had chest pain rule out labs obtained in triage.  On my evaluation she is resting comfortably in bed.  Is breathing without difficulty is not tachycardic tachypneic or hypoxic.  Vital signs within normal limits afebrile and well-appearing.  Lungs are clear to auscultation all fields.   I personally reviewed all laboratory work and imaging.  Metabolic panel without any acute abnormality specifically kidney function within normal limits and no significant electrolyte abnormalities. CBC without leukocytosis or significant anemia.   Troponins x2 within normal limits.  No delta.  COVID influenza negative.  Chest x-ray unremarkable.  There is no infiltrate or other abnormality such as pneumothorax or pulmonary edema noted on chest x-ray.  EKG without any ST-T wave abnormalities.  The medical records were personally reviewed by myself. I personally reviewed all lab results and interpreted all imaging studies and either concurred with their official read or contacted radiology for clarification. Additional history obtained from old records/EMS/family members.  This patient appears reasonably screened  and I doubt any other medical condition requiring further workup, evaluation, or  treatment in the ED at this time prior to discharge.   Patient's vitals are WNL apart from vital sign abnormalities discussed above, patient is in NAD, and able to ambulate in the ED at their baseline and able to tolerate PO.  Pain has been managed or a plan has been made for home management and has no complaints prior to discharge. Patient is comfortable with above plan and for discharge at this time. All questions were answered prior to disposition. Results from the ER workup discussed with the patient face to face and all questions answered to the best of my ability. The patient is safe for discharge with strict return precautions. Patient appears safe for discharge with appropriate follow-up. Conveyed my impression with the patient and they voiced understanding and are agreeable to plan.   An After Visit Summary was printed and given to the patient.  Portions of this note were generated with Scientist, clinical (histocompatibility and immunogenetics). Dictation errors may occur despite best attempts at proofreading.    [name] was evaluated in Emergency Department on 09/25/2020 for the symptoms described in the history of present illness. She was evaluated in the context of the global COVID-19 pandemic, which necessitated consideration that the patient might be at risk for infection with the SARS-CoV-2 virus that causes COVID-19. Institutional protocols and algorithms that pertain to the evaluation of patients at risk for COVID-19 are in a state of rapid change based on information released by regulatory bodies including the CDC and federal and state organizations. These policies and algorithms were followed during the patient's care in the ED.   Final Clinical Impression(s) / ED Diagnoses Final diagnoses:  SOB (shortness of breath)  Cough  Upper respiratory tract infection, unspecified type  Chest pain, unspecified type    Rx / DC Orders ED Discharge Orders          Ordered    ondansetron (ZOFRAN ODT) 4 MG  disintegrating tablet  Every 8 hours PRN        09/25/20 1106    methocarbamol (ROBAXIN) 500 MG tablet  2 times daily        09/25/20 1106             Solon Augusta D[place], [place] 09/25/20 1448    Tegeler, Canary Brim, MD 09/25/20 1550

## 2020-09-25 NOTE — Discharge Instructions (Addendum)
Your symptoms are concerning for a viral illness.  Your workup today was reassuring. Your labs were unremarkable (a good thing).   Please drink plenty of water.  Please take Zofran as needed for nausea and the muscle relaxer Robaxin as needed for discomfort please use Tylenol and ibuprofen as discussed below.  Please follow-up with your primary care provider.  You may always return to the ER for any new or concerning symptoms.  Please use Tylenol or ibuprofen for pain.  You may use 600 mg ibuprofen every 6 hours or 1000 mg of Tylenol every 6 hours.  You may choose to alternate between the 2.  This would be most effective.  Not to exceed 4 g of Tylenol within 24 hours.  Not to exceed 3200 mg ibuprofen 24 hours.   Your COVID test is pending.  You will be able to see the results of this test and MyChart.

## 2020-09-25 NOTE — ED Triage Notes (Addendum)
Patient states she was exposed to Covid at her job.  Patient c/o chest congestion, chest pain, nasal congestion, sore throat, SOB, fatigue, and body aches x 2 days.  Patient states she had a negative Covid home test yesterday.

## 2020-09-25 NOTE — ED Notes (Signed)
Patient taken to xray by rad tech

## 2020-09-25 NOTE — ED Notes (Signed)
EKG given to Dr. Floyd 

## 2020-10-24 ENCOUNTER — Encounter: Payer: Medicaid Other | Attending: Family Medicine | Admitting: Dietician

## 2020-10-24 ENCOUNTER — Other Ambulatory Visit: Payer: Self-pay

## 2020-10-24 ENCOUNTER — Encounter: Payer: Self-pay | Admitting: Dietician

## 2020-10-24 VITALS — Ht 64.0 in | Wt 263.8 lb

## 2020-10-24 DIAGNOSIS — E119 Type 2 diabetes mellitus without complications: Secondary | ICD-10-CM | POA: Diagnosis not present

## 2020-10-24 NOTE — Progress Notes (Signed)
Diabetes Self-Management Education  Visit Type: First/Initial  Appt. Start Time: 1445 Appt. End Time: 1550  10/24/2020  [name], identified by name and date of birth, is a 25 y.o. female with a diagnosis of Diabetes: Type 2.   ASSESSMENT  RD provided sample glucometer and training on how to use it. RD checked BG during visit, CBG - 138, pt ate about an hour before check. AccChek Guide Me Lot #: [id number] S/N: [id number] Exp. Date: 12/12/2021  Pt reports being without their metformin for about a month now due to cost and lack of communication between doctor and pharmacy. Pt reports they have not been prescribed a glucometer, even after multiple requests to their referring provider. Pt states they get symptoms of fatigue, hunger, dehydration and are not sure what their blood sugar values are. Pt states they are experiencing tingling and pain in their feet and fingers. Pt reports having acanthosis on their neck as well, has not been evaluated for PCOS. Pt reports their SNAP benefits have been reduced and may have to get a second job to afford food. Pt gets assistance from their mother, and utilizing food banks. Pt reports stress levels have been elevated in the past, but they are currently at a good level. Pt reports snacking between meals is a weakness for them.  Height 5\' 4"  (1.626 m), weight 263 lb 12.8 oz (119.7 kg), unknown if currently breastfeeding. Body mass index is 45.28 kg/m.   Diabetes Self-Management Education - 10/24/20 1501       Visit Information   Visit Type First/Initial      Initial Visit   Diabetes Type Type 2    Are you currently following a meal plan? No    Are you taking your medications as prescribed? Yes   When able to afford their metformin.   Date Diagnosed 05/2020      Health Coping   How would you rate your overall health? Poor      Psychosocial Assessment   Patient Belief/Attitude about Diabetes Motivated to manage diabetes     Self-care barriers Lack of material resources   Financial difficulties   Self-management support Family   Lack of support from provider   Other persons present Patient    Patient Concerns Nutrition/Meal planning;Glycemic Control;Medication;Monitoring    Special Needs None    Preferred Learning Style No preference indicated    Learning Readiness Not Ready    How often do you need to have someone help you when you read instructions, pamphlets, or other written materials from your doctor or pharmacy? 1 - Never    What is the last grade level you completed in school? Some college      Pre-Education Assessment   Patient understands the diabetes disease and treatment process. Needs Instruction    Patient understands incorporating nutritional management into lifestyle. Needs Instruction    Patient undertands incorporating physical activity into lifestyle. Needs Instruction    Patient understands using medications safely. Needs Instruction    Patient understands monitoring blood glucose, interpreting and using results Needs Instruction    Patient understands prevention, detection, and treatment of acute complications. Needs Instruction    Patient understands prevention, detection, and treatment of chronic complications. Needs Instruction    Patient understands how to develop strategies to address psychosocial issues. Needs Instruction    Patient understands how to develop strategies to promote health/change behavior. Needs Instruction      Complications   Last HgB A1C per patient/outside source --  Unable to get labs from referring provider   How often do you check your blood sugar? Not recommended by provider    Have you had a dilated eye exam in the past 12 months? Yes    Have you had a dental exam in the past 12 months? Yes    Are you checking your feet? Yes    How many days per week are you checking your feet? 4      Dietary Intake   Breakfast Breakfast sandwich whoel grain toast, 1 egg, 1  sausage patty, whole milk    Lunch Chicken wrap, chicken tenders, lettuce, on a flour tortilla    Dinner [place] steak nachos, cheese    Beverage(s) water, gatorade zero      Exercise   Exercise Type ADL's    How many days per week to you exercise? 5    How many minutes per day do you exercise? 20    Total minutes per week of exercise 100      Patient Education   Disease state  Definition of diabetes, type 1 and 2, and the diagnosis of diabetes;Factors that contribute to the development of diabetes;Explored patient's options for treatment of their diabetes    Nutrition management  Carbohydrate counting;Food label reading, portion sizes and measuring food.;Role of diet in the treatment of diabetes and the relationship between the three main macronutrients and blood glucose level;Meal options for control of blood glucose level and chronic complications.    Physical activity and exercise  Role of exercise on diabetes management, blood pressure control and cardiac health.    Medications Reviewed patients medication for diabetes, action, purpose, timing of dose and side effects.    Monitoring Taught/evaluated SMBG meter.;Purpose and frequency of SMBG.;Identified appropriate SMBG and/or A1C goals.    Chronic complications Identified and discussed with patient  current chronic complications;Relationship between chronic complications and blood glucose control    Psychosocial adjustment Role of stress on diabetes      Individualized Goals (developed by patient)   Nutrition Follow meal plan discussed;General guidelines for healthy choices and portions discussed    Physical Activity Exercise 3-5 times per week    Medications take my medication as prescribed    Monitoring  test my blood glucose as discussed;send in my blood glucose log as discussed    Reducing Risk examine blood glucose patterns;do foot checks daily      Post-Education Assessment   Patient understands the diabetes disease and  treatment process. Needs Review    Patient understands incorporating nutritional management into lifestyle. Needs Review    Patient undertands incorporating physical activity into lifestyle. Needs Review    Patient understands using medications safely. Needs Review    Patient understands monitoring blood glucose, interpreting and using results Needs Review    Patient understands prevention, detection, and treatment of acute complications. Needs Review    Patient understands prevention, detection, and treatment of chronic complications. Needs Review    Patient understands how to develop strategies to address psychosocial issues. Needs Review    Patient understands how to develop strategies to promote health/change behavior. Needs Review      Outcomes   Expected Outcomes Demonstrated interest in learning. Expect positive outcomes    Future DMSE 2 months    Program Status Not Completed             Individualized Plan for Diabetes Self-Management Training:   Learning Objective:  Patient will have a greater understanding of diabetes self-management. Patient  education plan is to attend individual and/or group sessions per assessed needs and concerns.   Plan:   Patient Instructions  Check your blood sugar each morning. Look for numbers to be under 125.   Look into the [employer] at [address] for affordable produce.  They will match your EBT up to $15!  Work your way towards 150 minutes of moderate intensity physical activity a week. Rememeber to get your heart pumping and breathing a little heavier!  Look for "Carb Smart" or "Carb Balance" low carb tortillas  Work towards eating three meals a day, about 5-6 hours apart!  Begin to recognize carbohydrates in your food choices!  Have 2-3 servings of carb at each meal (30-45 g), and 1 serving of carbs and protein for a snack.  Begin to build your meals using the proportions of the Balanced Plate. First,  select your carb choice(s) for the meal, and determine how much you should have to equal (30-45 g). Next, select your source of protein to pair with your carb choice(s). Finally, complete the remaining half of your meal with a variety of non-starchy vegetables.   Expected Outcomes:  Demonstrated interest in learning. Expect positive outcomes  Education material provided: ADA - How to Thrive: A Guide for Your Journey with Diabetes, My Plate, Snack sheet, and Carbohydrate counting sheet  If problems or questions, patient to contact team via:  Phone and Email  Future DSME appointment: 2 months

## 2020-10-24 NOTE — Patient Instructions (Addendum)
Check your blood sugar each morning. Look for numbers to be under 125.   Look into the [employer] at [address] for affordable produce.  They will match your EBT up to $15!  Work your way towards 150 minutes of moderate intensity physical activity a week. Rememeber to get your heart pumping and breathing a little heavier!  Look for "Carb Smart" or "Carb Balance" low carb tortillas  Work towards eating three meals a day, about 5-6 hours apart!  Begin to recognize carbohydrates in your food choices!  Have 2-3 servings of carb at each meal (30-45 g), and 1 serving of carbs and protein for a snack.  Begin to build your meals using the proportions of the Balanced Plate. First, select your carb choice(s) for the meal, and determine how much you should have to equal (30-45 g). Next, select your source of protein to pair with your carb choice(s). Finally, complete the remaining half of your meal with a variety of non-starchy vegetables.

## 2020-12-25 ENCOUNTER — Ambulatory Visit: Payer: Medicaid Other | Admitting: Dietician

## 2021-01-30 ENCOUNTER — Ambulatory Visit: Payer: Medicaid Other | Admitting: Dietician

## 2021-02-07 ENCOUNTER — Encounter: Payer: Self-pay | Admitting: Dietician

## 2021-02-07 ENCOUNTER — Encounter: Payer: Medicaid Other | Attending: Family Medicine | Admitting: Dietician

## 2021-02-07 ENCOUNTER — Other Ambulatory Visit: Payer: Self-pay

## 2021-02-07 VITALS — Ht 64.0 in | Wt 267.8 lb

## 2021-02-07 DIAGNOSIS — E119 Type 2 diabetes mellitus without complications: Secondary | ICD-10-CM | POA: Insufficient documentation

## 2021-02-07 NOTE — Patient Instructions (Addendum)
Take your metformin after you eat, on a full stomach.  Do not weigh yourself daily. If you do, make it once a week at the most!  Pay more attention to how you feel and how your clothes fit.  Pay attention to your hunger signals. Remember, that you are drinking enough water!

## 2021-02-07 NOTE — Progress Notes (Signed)
Diabetes Self-Management Education  Visit Type: Follow-up  Appt. Start Time: 1530 Appt. End Time: 1600  02/07/2021  [name], identified by name and date of birth, is a 26 y.o. female with a diagnosis of Diabetes:  .   ASSESSMENT Pt reports trying Rybelsus for 30 days, but their insurance won't cover it. Pt is currently taking metformin twice a day. Pt reports some GI distress when taking both Rybelsus and metformin together, but is not experiencing these currently while only taking metformin. Pt was given a sample [employer] as well, pt states it helped them control their blood sugar tremendously while using it. Pt insurance will not cover CGM either. Pt is using their glucometer now, no error readings or soreness in their fingers. Pt reports the tingling in their extremities has lessened, acanthosis has spread to arm pits and inner thighs. Pt reports one bout of hypoglycemia, BG of 50, pt drank some OJ and got their glucose back up.  **Pt reports trying to work on emotional eating, states their will be times they do not eat at all or times where they binge eat. Pt reports frustration with this and the resulting weight fluctuations. Pt is weighing daily. Pt reports increasing their water intake to keep them from eating too much.**  Height 5\' 4"  (1.626 m), weight 267 lb 12.8 oz (121.5 kg), unknown if currently breastfeeding. Body mass index is 45.97 kg/m.   Diabetes Self-Management Education - 02/07/21 1546       Visit Information   Visit Type Follow-up      Complications   How often do you check your blood sugar? 1-2 times/day    Fasting Blood glucose range (mg/dL) 04/07/21    Postprandial Blood glucose range (mg/dL) 93-818;299-371    Number of hypoglycemic episodes per month 1    Can you tell when your blood sugar is low? Yes    What do you do if your blood sugar is low? Yes, very low energy and shakiness.      Dietary Intake   Breakfast 2 nutrigrain bars,  water    Lunch Fruit cup, lean meat,small handful of fries      Individualized Goals (developed by patient)   Nutrition Follow meal plan discussed    Medications take my medication as prescribed    Monitoring  test my blood glucose as discussed      Patient Self-Evaluation of Goals - Patient rates self as meeting previously set goals (% of time)   Nutrition 25 - 50%    Physical Activity 25 - 50%    Medications >75%    Monitoring >75%    Problem Solving 25 - 50%    Reducing Risk 25 - 50%    Health Coping < 25%      Post-Education Assessment   Patient understands the diabetes disease and treatment process. Needs Review    Patient understands incorporating nutritional management into lifestyle. Needs Review    Patient undertands incorporating physical activity into lifestyle. Needs Review    Patient understands using medications safely. Needs Review    Patient understands monitoring blood glucose, interpreting and using results Needs Review    Patient understands prevention, detection, and treatment of acute complications. Needs Review    Patient understands prevention, detection, and treatment of chronic complications. Needs Review    Patient understands how to develop strategies to address psychosocial issues. Needs Review    Patient understands how to develop strategies to promote health/change behavior. Needs Review  Outcomes   Expected Outcomes Demonstrated interest in learning. Expect positive outcomes    Future DMSE 2 months    Program Status Not Completed      Subsequent Visit   Since your last visit have you continued or begun to take your medications as prescribed? Yes    Since your last visit have you had your blood pressure checked? Yes    Is your most recent blood pressure lower, unchanged, or higher since your last visit? Unchanged    Since your last visit have you experienced any weight changes? Gain    Weight Gain (lbs) 4    Since your last visit, are you  checking your blood glucose at least once a day? Yes   Had sample Freestyle Lbre, uses glucometer now            Individualized Plan for Diabetes Self-Management Training:   Learning Objective:  Patient will have a greater understanding of diabetes self-management. Patient education plan is to attend individual and/or group sessions per assessed needs and concerns.   Plan:   Patient Instructions  Take your metformin after you eat, on a full stomach.  Do not weigh yourself daily. If you do, make it once a week at the most!  Pay more attention to how you feel and how your clothes fit.  Pay attention to your hunger signals. Remember, that you are drinking enough water!    Expected Outcomes:  Demonstrated interest in learning. Expect positive outcomes  If problems or questions, patient to contact team via:  Phone and Email  Future DSME appointment: 2 months

## 2021-03-20 ENCOUNTER — Ambulatory Visit: Payer: Medicaid Other | Admitting: Registered"

## 2021-03-26 ENCOUNTER — Emergency Department (HOSPITAL_COMMUNITY): Payer: Medicaid Other

## 2021-03-26 ENCOUNTER — Emergency Department (HOSPITAL_COMMUNITY)
Admission: EM | Admit: 2021-03-26 | Discharge: 2021-03-26 | Disposition: A | Discharge status: Home / Self Care | Payer: Medicaid Other | Attending: Student | Admitting: Student

## 2021-03-26 ENCOUNTER — Encounter (HOSPITAL_COMMUNITY): Payer: Self-pay

## 2021-03-26 ENCOUNTER — Other Ambulatory Visit: Payer: Self-pay

## 2021-03-26 DIAGNOSIS — S8992XA Unspecified injury of left lower leg, initial encounter: Secondary | ICD-10-CM | POA: Diagnosis present

## 2021-03-26 DIAGNOSIS — Y9301 Activity, walking, marching and hiking: Secondary | ICD-10-CM | POA: Insufficient documentation

## 2021-03-26 DIAGNOSIS — S80212A Abrasion, left knee, initial encounter: Secondary | ICD-10-CM | POA: Insufficient documentation

## 2021-03-26 DIAGNOSIS — Y92410 Unspecified street and highway as the place of occurrence of the external cause: Secondary | ICD-10-CM | POA: Insufficient documentation

## 2021-03-26 DIAGNOSIS — W19XXXA Unspecified fall, initial encounter: Secondary | ICD-10-CM

## 2021-03-26 DIAGNOSIS — W010XXA Fall on same level from slipping, tripping and stumbling without subsequent striking against object, initial encounter: Secondary | ICD-10-CM | POA: Diagnosis not present

## 2021-03-26 DIAGNOSIS — Z23 Encounter for immunization: Secondary | ICD-10-CM | POA: Insufficient documentation

## 2021-03-26 DIAGNOSIS — M25562 Pain in left knee: Secondary | ICD-10-CM

## 2021-03-26 MED ORDER — TETANUS-DIPHTH-ACELL PERTUSSIS 5-2.5-18.5 LF-MCG/0.5 IM SUSY
0.5000 mL | PREFILLED_SYRINGE | Freq: Once | INTRAMUSCULAR | Status: AC
  Administered 2021-03-26: 0.5 mL via INTRAMUSCULAR
  Filled 2021-03-26: qty 0.5

## 2021-03-26 MED ORDER — BACITRACIN ZINC 500 UNIT/GM EX OINT
TOPICAL_OINTMENT | Freq: Two times a day (BID) | CUTANEOUS | Status: DC
  Administered 2021-03-26: 2 via TOPICAL
  Filled 2021-03-26: qty 1.8

## 2021-03-26 NOTE — ED Provider Notes (Signed)
[facility]-EMERGENCY DEPT Provider Note   CSN: [id number] Arrival date & time: 03/26/21  2049    History  fall  [name] is a 26 y.o. female here for evaluation of fall. Tripped and fell while walking. Abrasion to left anterior knee. Ambulatory PTA. Denies hitting head, LOC. No midline back pain. No meds PTA. No numbness, weakness, swelling, redness.  HPI     Home Medications Prior to Admission medications   Medication Sig Start Date End Date Taking? Authorizing Provider  buPROPion (WELLBUTRIN XL) 300 MG 24 hr tablet Take 1 tablet (300 mg total) by mouth daily. For mood 03/16/18   Aldean Baker, NP  hydrOXYzine (ATARAX/VISTARIL) 50 MG tablet Take 1 tablet (50 mg total) by mouth 3 (three) times daily as needed for anxiety (sleep). 03/15/18   Aldean Baker, NP  ibuprofen (ADVIL) 200 MG tablet Take 600-800 mg by mouth every 6 (six) hours as needed for headache or mild pain.    [provider]  Melatonin 5 MG TABS Take 1 tablet (5 mg total) by mouth at bedtime. For sleep Patient taking differently: Take 5 mg by mouth at bedtime as needed (sleep). 03/15/18   Aldean Baker, NP  methocarbamol (ROBAXIN) 500 MG tablet Take 1 tablet (500 mg total) by mouth 2 (two) times daily. 09/25/20   Gailen Shelter, PA  ondansetron (ZOFRAN ODT) 4 MG disintegrating tablet Take 1 tablet (4 mg total) by mouth every 8 (eight) hours as needed for nausea or vomiting. 09/25/20   Gailen Shelter, PA  prednisoLONE acetate (PRED FORTE) 1 % ophthalmic suspension Place 1 drop into the right eye daily. 12/30/17   [provider]  sertraline (ZOLOFT) 50 MG tablet Take 1 tablet (50 mg total) by mouth daily. For mood Patient taking differently: Take 50 mg by mouth daily. 03/16/18   Aldean Baker, NP  triamcinolone (KENALOG) 0.1 % Apply 1 application topically 2 (two) times daily. Patient taking differently: Apply 1 application topically 2 (two) times daily as needed  (dry/itchy skin). 03/20/20   Jeannie Fend, PA-C      Allergies    Shrimp [shellfish allergy], Amoxicillin, Clindamycin/lincomycin, and Doxycycline    Review of Systems   Review of Systems  Constitutional: Negative.   HENT: Negative.    Respiratory: Negative.    Cardiovascular: Negative.   Gastrointestinal: Negative.   Genitourinary: Negative.   Musculoskeletal:        Left knee pain  Skin:  Positive for wound.  Neurological: Negative.   All other systems reviewed and are negative.  Physical Exam Updated Vital Signs BP (!) 153/81    Pulse 95    Temp 98.6 F (37 C) (Oral)    Resp 17    Ht 5\' 5"  (1.651 m)    Wt 114.3 kg    SpO2 100%    BMI 41.93 kg/m  Physical Exam Vitals and nursing note reviewed.  Constitutional:      General: She is not in acute distress.    Appearance: She is well-developed. She is not ill-appearing, toxic-appearing or diaphoretic.  HENT:     Head: Normocephalic and atraumatic.     Nose: Nose normal.     Mouth/Throat:     Mouth: Mucous membranes are moist.  Eyes:     Pupils: Pupils are equal, round, and reactive to light.  Cardiovascular:     Rate and Rhythm: Normal rate.     Pulses: Normal pulses.  Dorsalis pedis pulses are 2+ on the left side.       Posterior tibial pulses are 2+ on the left side.     Heart sounds: Normal heart sounds.  Pulmonary:     Effort: Pulmonary effort is normal. No respiratory distress.     Breath sounds: Normal breath sounds.  Abdominal:     General: Bowel sounds are normal. There is no distension.     Palpations: Abdomen is soft.  Musculoskeletal:        General: Normal range of motion.     Cervical back: Normal range of motion.     Comments: Tenderness to anterior left knee with overlying abrasion. Non tender femur, tib fib, ankle. Full ROM without difficulty. No midline C/T/L tenderness. Neg anterior drawer  Skin:    General: Skin is warm and dry.     Capillary Refill: Capillary refill takes less than 2  seconds.     Comments: Abrasion to left anterior knee  Neurological:     General: No focal deficit present.     Mental Status: She is alert and oriented to person, place, and time.     Comments: Intact sensation Ambulatory Equal strength  Psychiatric:        Mood and Affect: Mood normal.   ED Results / Procedures / Treatments   Labs (all labs ordered are listed, but only abnormal results are displayed) Labs Reviewed - No data to display  EKG None  Radiology DG Knee Complete 4 Views Left  Result Date: 03/26/2021 CLINICAL DATA:  Post fall. Left knee pain after fall onto pavement today. Abrasion. EXAM: LEFT KNEE - COMPLETE 4+ VIEW COMPARISON:  None. FINDINGS: No evidence of fracture, dislocation, or joint effusion. No evidence of arthropathy or other focal bone abnormality. No radiopaque foreign body or soft tissue gas. Soft tissues are unremarkable. IMPRESSION: Negative radiographs of the left knee. Electronically Signed   By: Narda Rutherford M.D.   On: 03/26/2021 21:35    Procedures Procedures    Medications Ordered in ED Medications  Tdap (BOOSTRIX) injection 0.5 mL (has no administration in time range)  bacitracin ointment (has no administration in time range)   ED Course/ Medical Decision Making/ A&P    26 year old here for evaluation after mechanical fall PTA. Abrasion to left anterior knee. Full ROM to BLE. Tenderness to inferior aspect knee. NV intact. Non tender femur, tib/fin, ankle, foot. Unknown tetanus, will up date and clean abrasion. No lacerations to suture. Ambulatory  Imaging personally viewed and interpreted:  Xray knee without acute abnormality  Patient reassessed. Discussed imaging. Wound cleaned. Bacitracin applied. Dc home with RICE for sx management and close outpatient FU. Low suspicion for occult fx, dislocation.  The patient has been appropriately medically screened and/or stabilized in the ED. I have low suspicion for any other emergent medical  condition which would require further screening, evaluation or treatment in the ED or require inpatient management.  Patient is hemodynamically stable and in no acute distress.  Patient able to ambulate in department prior to ED.  Evaluation does not show acute pathology that would require ongoing or additional emergent interventions while in the emergency department or further inpatient treatment.  I have discussed the diagnosis with the patient and answered all questions.  Pain is been managed while in the emergency department and patient has no further complaints prior to discharge.  Patient is comfortable with plan discussed in room and is stable for discharge at this time.  I  have discussed strict return precautions for returning to the emergency department.  Patient was encouraged to follow-up with PCP/specialist refer to at discharge.                           Medical Decision Making Amount and/or Complexity of Data Reviewed External Data Reviewed: radiology and notes. Radiology: ordered and independent interpretation performed. Decision-making details documented in ED Course.  Risk OTC drugs. Prescription drug management. Risk Details: Do not feel patient needs additional labs, imaging, hospitalization at this time.         Final Clinical Impression(s) / ED Diagnoses Final diagnoses:  Fall, initial encounter  Acute pain of left knee    Rx / DC Orders ED Discharge Orders     None         Rhianon Zabawa A, PA-C 03/26/21 2149    Glendora Score, MD 03/26/21 2329

## 2021-03-26 NOTE — Discharge Instructions (Addendum)
Use the cream I have prescribed on the wound.  Make sure to take tylenol or ibuprofen for pain  Follow up with primary care provider for new or worsening symptoms

## 2021-03-26 NOTE — ED Triage Notes (Signed)
Pt was crossing the street, fell, and scraped her left knee.

## 2021-04-16 ENCOUNTER — Encounter: Payer: Self-pay | Admitting: Registered"

## 2021-04-16 ENCOUNTER — Other Ambulatory Visit: Payer: Self-pay

## 2021-04-16 ENCOUNTER — Encounter: Payer: Medicaid Other | Attending: Family Medicine | Admitting: Registered"

## 2021-04-16 DIAGNOSIS — Z713 Dietary counseling and surveillance: Secondary | ICD-10-CM | POA: Insufficient documentation
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
## 2021-04-16 NOTE — Patient Instructions (Addendum)
-   Aim to weigh no more than once a week; pack away scale.  ? ?- Aim to eat about every 3-5 hours.  ? ?- Continue to have 3 meals a day.  ?

## 2021-05-06 ENCOUNTER — Emergency Department (HOSPITAL_COMMUNITY)
Admission: EM | Admit: 2021-05-06 | Discharge: 2021-05-06 | Disposition: A | Discharge status: Home / Self Care | Payer: Medicaid Other | Attending: Emergency Medicine | Admitting: Emergency Medicine

## 2021-05-06 ENCOUNTER — Other Ambulatory Visit: Payer: Self-pay

## 2021-05-06 ENCOUNTER — Encounter (HOSPITAL_COMMUNITY): Payer: Self-pay

## 2021-05-06 DIAGNOSIS — R059 Cough, unspecified: Secondary | ICD-10-CM | POA: Diagnosis present

## 2021-05-06 DIAGNOSIS — Z20822 Contact with and (suspected) exposure to covid-19: Secondary | ICD-10-CM | POA: Insufficient documentation

## 2021-05-06 DIAGNOSIS — J069 Acute upper respiratory infection, unspecified: Secondary | ICD-10-CM | POA: Diagnosis not present

## 2021-05-06 LAB — RESP PANEL BY RT-PCR (FLU A&B, COVID) ARPGX2
Influenza A by PCR: NEGATIVE
Influenza B by PCR: NEGATIVE
SARS Coronavirus 2 by RT PCR: NEGATIVE

## 2021-05-06 MED ORDER — CETIRIZINE HCL 10 MG PO TABS: 10.0000 mg | ORAL_TABLET | Freq: Every day | ORAL | 0 refills | Status: DC

## 2021-05-06 MED ORDER — FLUTICASONE PROPIONATE 50 MCG/ACT NA SUSP: 1.0000 | Freq: Every day | NASAL | 2 refills | Status: DC

## 2021-05-06 NOTE — ED Triage Notes (Signed)
Pt reports generalized body aches, cough, fever, and congestion for about a week. Denies chest pain and SHOB. ?

## 2021-05-06 NOTE — ED Notes (Signed)
I provided reinforced discharge education based off of discharge instructions. Pt acknowledged and understood my education. Pt had no further questions/concerns for provider/myself.  °

## 2021-05-06 NOTE — ED Provider Notes (Signed)
?[facility]-EMERGENCY DEPT ?Provider Note ? ? ?CSN: [id number] ?Arrival date & time: 05/06/21  1248 ? ?  ? ?History ? ?Chief Complaint  ?Patient presents with  ? Generalized Body Aches  ? Cough  ? ? ?[name] is a 26 y.o. female. ? ?26 year old female presents with complaint of cough, sneezing, congestion with body aches, headache and sore throat x1 week.  Patient presents today because she is starting to have bloody nasal mucus and coughing up bloody mucus.  Denies fevers, known sick contacts, exposure to TB.  No history of asthma or chronic lung disease.  Patient is taking ibuprofen for her symptoms at home, no other symptom relief medications. ? ? ?  ? ?Home Medications ?Prior to Admission medications   ?Medication Sig Start Date End Date Taking? Authorizing Provider  ?cetirizine (ZYRTEC ALLERGY) 10 MG tablet Take 1 tablet (10 mg total) by mouth daily. 05/06/21  Yes Jeannie Fend, PA-C  ?fluticasone (FLONASE) 50 MCG/ACT nasal spray Place 1 spray into both nostrils daily. 05/06/21  Yes Jeannie Fend, PA-C  ?buPROPion (WELLBUTRIN XL) 300 MG 24 hr tablet Take 1 tablet (300 mg total) by mouth daily. For mood 03/16/18   Aldean Baker, NP  ?hydrOXYzine (ATARAX/VISTARIL) 50 MG tablet Take 1 tablet (50 mg total) by mouth 3 (three) times daily as needed for anxiety (sleep). 03/15/18   Aldean Baker, NP  ?ibuprofen (ADVIL) 200 MG tablet Take 600-800 mg by mouth every 6 (six) hours as needed for headache or mild pain.    [provider]  ?Melatonin 5 MG TABS Take 1 tablet (5 mg total) by mouth at bedtime. For sleep ?Patient taking differently: Take 5 mg by mouth at bedtime as needed (sleep). 03/15/18   Aldean Baker, NP  ?methocarbamol (ROBAXIN) 500 MG tablet Take 1 tablet (500 mg total) by mouth 2 (two) times daily. 09/25/20   Gailen Shelter, PA  ?ondansetron (ZOFRAN ODT) 4 MG disintegrating tablet Take 1 tablet (4 mg total) by mouth every 8 (eight) hours as needed for nausea or  vomiting. 09/25/20   Gailen Shelter, PA  ?prednisoLONE acetate (PRED FORTE) 1 % ophthalmic suspension Place 1 drop into the right eye daily. 12/30/17   [provider]  ?sertraline (ZOLOFT) 50 MG tablet Take 1 tablet (50 mg total) by mouth daily. For mood ?Patient taking differently: Take 50 mg by mouth daily. 03/16/18   Aldean Baker, NP  ?triamcinolone (KENALOG) 0.1 % Apply 1 application topically 2 (two) times daily. ?Patient taking differently: Apply 1 application topically 2 (two) times daily as needed (dry/itchy skin). 03/20/20   Jeannie Fend, PA-C  ?   ? ?Allergies    ?Shrimp [shellfish allergy], Amoxicillin, Clindamycin/lincomycin, and Doxycycline   ? ?Review of Systems   ?Review of Systems ?Negative except as per HPI ?Physical Exam ?Updated Vital Signs ?BP 138/88 (BP Location: Right Arm)   Pulse (!) 103   Temp 98.8 ?F (37.1 ?C) (Oral)   Resp 20   SpO2 100%  ?Physical Exam ?Vitals and nursing note reviewed.  ?Constitutional:   ?   General: She is not in acute distress. ?   Appearance: She is well-developed. She is not diaphoretic.  ?HENT:  ?   Head: Normocephalic and atraumatic.  ?   Comments: Scant blood in right nare, no active bleeding  ?   Right Ear: Tympanic membrane and ear canal normal.  ?   Left Ear: Tympanic membrane and ear canal  normal.  ?   Nose: Congestion present.  ?   Mouth/Throat:  ?   Mouth: Mucous membranes are moist.  ?   Pharynx: No oropharyngeal exudate or posterior oropharyngeal erythema.  ?Eyes:  ?   Conjunctiva/sclera: Conjunctivae normal.  ?Cardiovascular:  ?   Rate and Rhythm: Normal rate and regular rhythm.  ?   Heart sounds: Normal heart sounds.  ?Pulmonary:  ?   Effort: Pulmonary effort is normal.  ?   Breath sounds: Normal breath sounds.  ?Musculoskeletal:  ?   Cervical back: Neck supple.  ?Lymphadenopathy:  ?   Cervical: No cervical adenopathy.  ?Skin: ?   General: Skin is warm and dry.  ?   Findings: No erythema or rash.  ?Neurological:  ?   Mental Status: She  is alert and oriented to person, place, and time.  ?Psychiatric:     ?   Behavior: Behavior normal.  ? ? ?ED Results / Procedures / Treatments   ?Labs ?(all labs ordered are listed, but only abnormal results are displayed) ?Labs Reviewed  ?RESP PANEL BY RT-PCR (FLU A&B, COVID) [id number]  ? ? ?EKG ?None ? ?Radiology ?No results found. ? ?Procedures ?Procedures  ? ? ?Medications Ordered in ED ?Medications - No data to display ? ?ED Course/ Medical Decision Making/ A&P ?  ?                        ?Medical Decision Making ?Risk ?OTC drugs. ? ? ?26 yo female with URI symptoms x 1 week. Patient is well appearing on exam, small amt of dried blood in the right nare. I suspect this is the source of the blood patient is seeing in her mucous. Plan is to sent COVID/flu test, recommend zyrtec and flonase, saline sinus rinse. Recheck with PCP if symptoms continue.  ? ? ? ? ? ? ? ?Final Clinical Impression(s) / ED Diagnoses ?Final diagnoses:  ?Viral upper respiratory tract infection  ? ? ?Rx / DC Orders ?ED Discharge Orders   ? ?      Ordered  ?  fluticasone (FLONASE) 50 MCG/ACT nasal spray  Daily       ? 05/06/21 1309  ?  cetirizine (ZYRTEC ALLERGY) 10 MG tablet  Daily       ? 05/06/21 1309  ? ?  ?  ? ?  ? ? ?  ?Jeannie Fend, PA-C ?05/06/21 1325 ? ?  ?Derwood Kaplan, MD ?05/07/21 0848 ? ?

## 2021-05-06 NOTE — Discharge Instructions (Signed)
Saline rinse to nose twice daily. ?Zyrtec and Flonase as prescribed. ?Your COVID/flu results will be available in your mychart account later this evening. If positive, you may return with a mask or as per your workplace protocol.  ?

## 2021-05-08 ENCOUNTER — Encounter: Payer: Medicaid Other | Attending: Family Medicine | Admitting: Registered"

## 2021-05-08 DIAGNOSIS — Z713 Dietary counseling and surveillance: Secondary | ICD-10-CM | POA: Insufficient documentation

## 2021-07-02 ENCOUNTER — Ambulatory Visit: Payer: Medicaid Other | Admitting: Registered"

## 2022-10-24 ENCOUNTER — Other Ambulatory Visit (HOSPITAL_COMMUNITY)
Admission: RE | Admit: 2022-10-24 | Discharge: 2022-10-24 | Disposition: A | Discharge status: Home / Self Care | Payer: Medicaid Other | Source: Ambulatory Visit | Attending: Family Medicine | Admitting: Family Medicine

## 2022-10-24 ENCOUNTER — Encounter: Payer: Self-pay | Admitting: Student

## 2022-10-24 ENCOUNTER — Ambulatory Visit (INDEPENDENT_AMBULATORY_CARE_PROVIDER_SITE_OTHER): Payer: Medicaid Other | Admitting: Student

## 2022-10-24 VITALS — BP 127/69 | HR 97 | Ht 65.0 in | Wt 263.2 lb

## 2022-10-24 DIAGNOSIS — R102 Pelvic and perineal pain: Secondary | ICD-10-CM

## 2022-10-24 DIAGNOSIS — Z23 Encounter for immunization: Secondary | ICD-10-CM | POA: Diagnosis not present

## 2022-10-24 DIAGNOSIS — Z124 Encounter for screening for malignant neoplasm of cervix: Secondary | ICD-10-CM | POA: Diagnosis not present

## 2022-10-24 LAB — POCT URINALYSIS DIP (MANUAL ENTRY)
Bilirubin, UA: NEGATIVE
Glucose, UA: NEGATIVE mg/dL
Ketones, POC UA: NEGATIVE mg/dL
Nitrite, UA: NEGATIVE
Protein Ur, POC: NEGATIVE mg/dL
Spec Grav, UA: 1.025 (ref 1.010–1.025)
Urobilinogen, UA: 0.2 E.U./dL
pH, UA: 5.5 (ref 5.0–8.0)

## 2022-10-24 LAB — POCT UA - MICROSCOPIC ONLY

## 2022-10-24 LAB — POCT WET PREP (WET MOUNT)
Clue Cells Wet Prep Whiff POC: NEGATIVE
Trichomonas Wet Prep HPF POC: ABSENT
WBC, Wet Prep HPF POC: >20

## 2022-10-24 LAB — POCT URINE PREGNANCY: Preg Test, Ur: NEGATIVE

## 2022-10-24 NOTE — Patient Instructions (Signed)
It was great to see you! Thank you for allowing me to participate in your care!   I recommend that you always bring your medications to each appointment as this makes it easy to ensure we are on the correct medications and helps [place] not miss when refills are needed.  Our plans for today:  - If you develop nausea, vomiting, fever, inability to eat or drink and continue to have pain on your right lower side, please go to emergency room to be evaluated for appendicitis - Schedule appointment to see me for annual exam  We are checking some labs today, I will call you if they are abnormal will send you a MyChart message or a letter if they are normal.  If you do not hear about your labs in the next 2 weeks please let us know.  Take care and seek immediate care sooner if you develop any concerns. Please remember to show up 15 minutes before your scheduled appointment time!  Tiffany Kocher, DO [facility]

## 2022-10-24 NOTE — Progress Notes (Signed)
    SUBJECTIVE:   CHIEF COMPLAINT / HPI:   Establish care Patient is here to establish care.  History of tabs updated.  Medication list updated.  Problem list updated.  Pelvic pain Patient reports pelvic pain for 2 days.  Increased vaginal discharge.  Sexually active with 1 partner.  Has IUD.  No dysuria, frequency, nausea, vomiting, fevers, other systemic symptoms.  Patient also due for Pap smear.  Pain primarily in right lower quadrant.  Does have some diarrhea.   OBJECTIVE:   BP 127/69   Pulse 97   Ht 5\' 5"  (1.651 m)   Wt 263 lb 4 oz (119.4 kg)   SpO2 100%   BMI 43.81 kg/m    General: NAD, pleasant Cardio: RRR, no MRG. Cap Refill <2s. Respiratory: CTAB, normal wob on RA GI: Abdomen is soft, not distended.  Pain to palpation in right lower quadrant.  No pain in left lower quadrant, periumbilical area, no pain with psoas testing on right. Skin: Warm and dry  Pelvic: VULVA: normal appearing vulva with no masses, tenderness or lesions, VAGINA: Normal appearing vagina with normal color, no lesions, with scant, white, and thin discharge present. CERVIX: No lesions, IUD strings not visible, discharge present. On bimanual exam there was no cervical motion tenderness, no masses felt.  Chaperone Tashira CMA present for pelvic exam  ASSESSMENT/PLAN:   Assessment & Plan Pelvic pain Hemodynamically stable, well-appearing, benign physical exam.  Differential is broad and includes: PID, ovarian cyst, urinary tract infection, renal stone, STD, appendicitis, viral enteritis, pregnancy.  No cervical motion tenderness, will not treat PID empirically, will follow-up on results of testing.  UA unremarkable for signs of infection or stones. UPT negative for pregnancy today. Low risk on Alvarado acute appendicitis scoring.  Will update Pap smear. - Follow-up STD testing - Return and ED precautions provided, particularly with appendicitis - Tylenol, ibuprofen for pain - Continue p.o. hydration  for diarrhea - Could consider pelvic ultrasound if not improving - Follow-up in 1-2 weeks if not improving   Tiffany Kocher, DO [facility]

## 2022-10-27 ENCOUNTER — Telehealth: Payer: Self-pay

## 2022-10-27 NOTE — Telephone Encounter (Signed)
Called patient. Confirmed identity.   Discussed lab values. No concern at this time.  Will follow-up for unresulted labs. Patient expressed understanding and agreement with plan.

## 2022-10-27 NOTE — Telephone Encounter (Signed)
Patient calls nurse line regarding lab results. She was able to view abnormal results via mychart. She is requesting returned call at [phone] to discuss questions further.   Veronda Prude, RN

## 2022-10-29 LAB — CYTOLOGY - PAP
Adequacy: ABSENT
Chlamydia: NEGATIVE
Comment: NEGATIVE
Comment: NEGATIVE
Comment: NORMAL
Diagnosis: NEGATIVE
Neisseria Gonorrhea: NEGATIVE
Trichomonas: NEGATIVE

## 2022-10-30 ENCOUNTER — Encounter: Payer: Self-pay | Admitting: Student

## 2022-10-31 ENCOUNTER — Emergency Department (HOSPITAL_BASED_OUTPATIENT_CLINIC_OR_DEPARTMENT_OTHER): Payer: Medicaid Other

## 2022-10-31 ENCOUNTER — Encounter (HOSPITAL_BASED_OUTPATIENT_CLINIC_OR_DEPARTMENT_OTHER): Payer: Self-pay

## 2022-10-31 ENCOUNTER — Observation Stay (HOSPITAL_COMMUNITY)
Admit: 2022-10-31 | Discharge: 2022-11-02 | Disposition: A | Discharge status: Home / Self Care | Payer: Medicaid Other | Source: Ambulatory Visit | Attending: General Surgery | Admitting: General Surgery

## 2022-10-31 ENCOUNTER — Ambulatory Visit (HOSPITAL_COMMUNITY): Payer: Medicaid Other

## 2022-10-31 ENCOUNTER — Encounter (HOSPITAL_COMMUNITY): Disposition: A | Discharge status: Home / Self Care | Payer: Self-pay | Source: Ambulatory Visit

## 2022-10-31 ENCOUNTER — Emergency Department (HOSPITAL_BASED_OUTPATIENT_CLINIC_OR_DEPARTMENT_OTHER)
Admission: EM | Admit: 2022-10-31 | Discharge: 2022-10-31 | Disposition: A | Discharge status: Home / Self Care | Payer: Medicaid Other | Source: Home / Self Care | Attending: Emergency Medicine | Admitting: Emergency Medicine

## 2022-10-31 ENCOUNTER — Ambulatory Visit: Payer: Medicaid Other | Admitting: Student

## 2022-10-31 ENCOUNTER — Encounter (HOSPITAL_COMMUNITY): Payer: Self-pay | Admitting: Surgery

## 2022-10-31 ENCOUNTER — Other Ambulatory Visit: Payer: Self-pay

## 2022-10-31 ENCOUNTER — Ambulatory Visit (HOSPITAL_BASED_OUTPATIENT_CLINIC_OR_DEPARTMENT_OTHER): Payer: Medicaid Other

## 2022-10-31 DIAGNOSIS — R739 Hyperglycemia, unspecified: Secondary | ICD-10-CM | POA: Insufficient documentation

## 2022-10-31 DIAGNOSIS — E119 Type 2 diabetes mellitus without complications: Secondary | ICD-10-CM | POA: Insufficient documentation

## 2022-10-31 DIAGNOSIS — K3580 Unspecified acute appendicitis: Principal | ICD-10-CM | POA: Diagnosis present

## 2022-10-31 DIAGNOSIS — Z87891 Personal history of nicotine dependence: Secondary | ICD-10-CM | POA: Insufficient documentation

## 2022-10-31 DIAGNOSIS — Z79899 Other long term (current) drug therapy: Secondary | ICD-10-CM | POA: Diagnosis not present

## 2022-10-31 DIAGNOSIS — K37 Unspecified appendicitis: Secondary | ICD-10-CM | POA: Diagnosis not present

## 2022-10-31 DIAGNOSIS — R109 Unspecified abdominal pain: Secondary | ICD-10-CM | POA: Diagnosis present

## 2022-10-31 HISTORY — PX: LAPAROSCOPIC APPENDECTOMY: SHX408

## 2022-10-31 LAB — COMPREHENSIVE METABOLIC PANEL
ALT: 23 U/L (ref 0–44)
AST: 13 U/L — ABNORMAL LOW (ref 15–41)
Albumin: 4.1 g/dL (ref 3.5–5.0)
Alkaline Phosphatase: 67 U/L (ref 38–126)
Anion gap: 9 (ref 5–15)
BUN: 11 mg/dL (ref 6–20)
CO2: 23 mmol/L (ref 22–32)
Calcium: 9.2 mg/dL (ref 8.9–10.3)
Chloride: 103 mmol/L (ref 98–111)
Creatinine, Ser: 0.44 mg/dL (ref 0.44–1.00)
GFR, Estimated: >60 mL/min (ref >60)
Glucose, Bld: 288 mg/dL — ABNORMAL HIGH (ref 70–99)
Potassium: 4 mmol/L (ref 3.5–5.1)
Sodium: 135 mmol/L (ref 135–145)
Total Bilirubin: 0.4 mg/dL (ref 0.3–1.2)
Total Protein: 7.3 g/dL (ref 6.5–8.1)

## 2022-10-31 LAB — CBC WITH DIFFERENTIAL/PLATELET
Abs Immature Granulocytes: 0.08 10*3/uL — ABNORMAL HIGH (ref 0.00–0.07)
Basophils Absolute: 0.1 10*3/uL (ref 0.0–0.1)
Basophils Relative: 1 %
Eosinophils Absolute: 0.4 10*3/uL (ref 0.0–0.5)
Eosinophils Relative: 4 %
HCT: 38.2 % (ref 36.0–46.0)
Hemoglobin: 13.4 g/dL (ref 12.0–15.0)
Immature Granulocytes: 1 %
Lymphocytes Relative: 22 %
Lymphs Abs: 2.3 10*3/uL (ref 0.7–4.0)
MCH: 29.7 pg (ref 26.0–34.0)
MCHC: 35.1 g/dL (ref 30.0–36.0)
MCV: 84.7 fL (ref 80.0–100.0)
Monocytes Absolute: 0.4 10*3/uL (ref 0.1–1.0)
Monocytes Relative: 4 %
Neutro Abs: 6.9 10*3/uL (ref 1.7–7.7)
Neutrophils Relative %: 68 %
Platelets: 431 10*3/uL — ABNORMAL HIGH (ref 150–400)
RBC: 4.51 MIL/uL (ref 3.87–5.11)
RDW: 12.2 % (ref 11.5–15.5)
WBC: 10.2 10*3/uL (ref 4.0–10.5)
nRBC: 0 % (ref 0.0–0.2)

## 2022-10-31 LAB — URINALYSIS, ROUTINE W REFLEX MICROSCOPIC
Bilirubin Urine: NEGATIVE
Glucose, UA: NEGATIVE mg/dL
Hgb urine dipstick: NEGATIVE
Ketones, ur: NEGATIVE mg/dL
Nitrite: NEGATIVE
Specific Gravity, Urine: 1.026 (ref 1.005–1.030)
pH: 6 (ref 5.0–8.0)

## 2022-10-31 LAB — GLUCOSE, CAPILLARY
Glucose-Capillary: 111 mg/dL — ABNORMAL HIGH (ref 70–99)
Glucose-Capillary: 163 mg/dL — ABNORMAL HIGH (ref 70–99)

## 2022-10-31 LAB — LIPASE, BLOOD: Lipase: 16 U/L (ref 11–51)

## 2022-10-31 LAB — PREGNANCY, URINE: Preg Test, Ur: NEGATIVE

## 2022-10-31 SURGERY — APPENDECTOMY, LAPAROSCOPIC
Anesthesia: General | Site: Abdomen

## 2022-10-31 MED ORDER — PROMETHAZINE HCL 25 MG/ML IJ SOLN
6.2500 mg | INTRAMUSCULAR | Status: DC | PRN
  Administered 2022-10-31: 6.25 mg via INTRAVENOUS

## 2022-10-31 MED ORDER — SUCCINYLCHOLINE CHLORIDE 200 MG/10ML IV SOSY
PREFILLED_SYRINGE | INTRAVENOUS | Status: DC | PRN
  Administered 2022-10-31: 160 mg via INTRAVENOUS

## 2022-10-31 MED ORDER — DEXAMETHASONE SODIUM PHOSPHATE 10 MG/ML IJ SOLN
INTRAMUSCULAR | Status: DC | PRN
  Administered 2022-10-31: 4 mg via INTRAVENOUS

## 2022-10-31 MED ORDER — MELATONIN 3 MG PO TABS: 3.0000 mg | ORAL_TABLET | Freq: Every evening | ORAL | Status: DC | PRN

## 2022-10-31 MED ORDER — MIDAZOLAM HCL 2 MG/2ML IJ SOLN
INTRAMUSCULAR | Status: AC
  Filled 2022-10-31: qty 2

## 2022-10-31 MED ORDER — METHOCARBAMOL 500 MG PO TABS: 500.0000 mg | ORAL_TABLET | Freq: Four times a day (QID) | ORAL | Status: DC | PRN

## 2022-10-31 MED ORDER — PROMETHAZINE HCL 25 MG/ML IJ SOLN
INTRAMUSCULAR | Status: AC
  Filled 2022-10-31: qty 1

## 2022-10-31 MED ORDER — HYDROMORPHONE HCL 1 MG/ML IJ SOLN
INTRAMUSCULAR | Status: AC
  Filled 2022-10-31: qty 0.5

## 2022-10-31 MED ORDER — ENOXAPARIN SODIUM 40 MG/0.4ML IJ SOSY
40.0000 mg | PREFILLED_SYRINGE | INTRAMUSCULAR | Status: DC
  Administered 2022-11-01 – 2022-11-02 (×2): 40 mg via SUBCUTANEOUS
  Filled 2022-10-31 (×2): qty 0.4

## 2022-10-31 MED ORDER — DIPHENHYDRAMINE HCL 25 MG PO CAPS: 25.0000 mg | ORAL_CAPSULE | Freq: Four times a day (QID) | ORAL | Status: DC | PRN

## 2022-10-31 MED ORDER — EPHEDRINE 5 MG/ML INJ
INTRAVENOUS | Status: AC
  Filled 2022-10-31: qty 5

## 2022-10-31 MED ORDER — FENTANYL CITRATE (PF) 250 MCG/5ML IJ SOLN
INTRAMUSCULAR | Status: DC | PRN
  Administered 2022-10-31 (×2): 50 ug via INTRAVENOUS
  Administered 2022-10-31: 100 ug via INTRAVENOUS
  Administered 2022-10-31: 50 ug via INTRAVENOUS

## 2022-10-31 MED ORDER — SUCCINYLCHOLINE CHLORIDE 200 MG/10ML IV SOSY
PREFILLED_SYRINGE | INTRAVENOUS | Status: AC
  Filled 2022-10-31: qty 10

## 2022-10-31 MED ORDER — OXYCODONE HCL 5 MG/5ML PO SOLN: 5.0000 mg | Freq: Once | ORAL | Status: DC | PRN

## 2022-10-31 MED ORDER — HYDROMORPHONE HCL 1 MG/ML IJ SOLN
0.2500 mg | INTRAMUSCULAR | Status: DC | PRN
  Administered 2022-10-31 (×4): 0.5 mg via INTRAVENOUS

## 2022-10-31 MED ORDER — CHLORHEXIDINE GLUCONATE 0.12 % MT SOLN
15.0000 mL | Freq: Once | OROMUCOSAL | Status: AC
  Administered 2022-10-31: 15 mL via OROMUCOSAL

## 2022-10-31 MED ORDER — SODIUM CHLORIDE 0.9 % IV SOLN
2.0000 g | Freq: Once | INTRAVENOUS | Status: AC
  Administered 2022-10-31: 2 g via INTRAVENOUS
  Filled 2022-10-31: qty 20

## 2022-10-31 MED ORDER — ROCURONIUM BROMIDE 10 MG/ML (PF) SYRINGE
PREFILLED_SYRINGE | INTRAVENOUS | Status: DC | PRN
  Administered 2022-10-31: 40 mg via INTRAVENOUS
  Administered 2022-10-31: 20 mg via INTRAVENOUS

## 2022-10-31 MED ORDER — SODIUM CHLORIDE 0.9 % IV BOLUS
1000.0000 mL | Freq: Once | INTRAVENOUS | Status: AC
  Administered 2022-10-31: 1000 mL via INTRAVENOUS

## 2022-10-31 MED ORDER — BUPIVACAINE-EPINEPHRINE (PF) 0.25% -1:200000 IJ SOLN
INTRAMUSCULAR | Status: AC
  Filled 2022-10-31: qty 30

## 2022-10-31 MED ORDER — SERTRALINE HCL 50 MG PO TABS
50.0000 mg | ORAL_TABLET | Freq: Every day | ORAL | Status: DC
  Administered 2022-11-01 – 2022-11-02 (×2): 50 mg via ORAL
  Filled 2022-10-31 (×2): qty 1

## 2022-10-31 MED ORDER — DEXAMETHASONE SODIUM PHOSPHATE 10 MG/ML IJ SOLN
INTRAMUSCULAR | Status: AC
  Filled 2022-10-31: qty 1

## 2022-10-31 MED ORDER — PHENYLEPHRINE 80 MCG/ML (10ML) SYRINGE FOR IV PUSH (FOR BLOOD PRESSURE SUPPORT)
PREFILLED_SYRINGE | INTRAVENOUS | Status: AC
  Filled 2022-10-31: qty 20

## 2022-10-31 MED ORDER — SODIUM CHLORIDE 0.9 % IR SOLN
Status: DC | PRN
  Administered 2022-10-31: 1000 mL

## 2022-10-31 MED ORDER — ONDANSETRON HCL 4 MG/2ML IJ SOLN
INTRAMUSCULAR | Status: AC
  Filled 2022-10-31: qty 2

## 2022-10-31 MED ORDER — METRONIDAZOLE 500 MG/100ML IV SOLN
500.0000 mg | Freq: Once | INTRAVENOUS | Status: AC
  Administered 2022-10-31: 500 mg via INTRAVENOUS
  Filled 2022-10-31: qty 100

## 2022-10-31 MED ORDER — ACETAMINOPHEN 10 MG/ML IV SOLN
INTRAVENOUS | Status: DC | PRN
Start: 2022-10-31 — End: 2022-10-31
  Administered 2022-10-31: 1000 mg via INTRAVENOUS

## 2022-10-31 MED ORDER — MIDAZOLAM HCL 5 MG/5ML IJ SOLN
INTRAMUSCULAR | Status: DC | PRN
  Administered 2022-10-31: 2 mg via INTRAVENOUS

## 2022-10-31 MED ORDER — ROCURONIUM BROMIDE 10 MG/ML (PF) SYRINGE
PREFILLED_SYRINGE | INTRAVENOUS | Status: AC
  Filled 2022-10-31: qty 20

## 2022-10-31 MED ORDER — ONDANSETRON HCL 4 MG/2ML IJ SOLN
INTRAMUSCULAR | Status: DC | PRN
  Administered 2022-10-31: 4 mg via INTRAVENOUS

## 2022-10-31 MED ORDER — ACETAMINOPHEN 500 MG PO TABS
1000.0000 mg | ORAL_TABLET | Freq: Four times a day (QID) | ORAL | Status: DC
  Administered 2022-11-01 – 2022-11-02 (×7): 1000 mg via ORAL
  Filled 2022-10-31 (×8): qty 2

## 2022-10-31 MED ORDER — LACTATED RINGERS IV SOLN: INTRAVENOUS | Status: DC

## 2022-10-31 MED ORDER — HYDROMORPHONE HCL 1 MG/ML IJ SOLN
0.5000 mg | INTRAMUSCULAR | Status: DC | PRN
  Administered 2022-11-01: 0.5 mg via INTRAVENOUS
  Filled 2022-10-31: qty 0.5

## 2022-10-31 MED ORDER — ACETAMINOPHEN 10 MG/ML IV SOLN
INTRAVENOUS | Status: AC
  Filled 2022-10-31: qty 100

## 2022-10-31 MED ORDER — OXYCODONE HCL 5 MG PO TABS
5.0000 mg | ORAL_TABLET | ORAL | Status: DC | PRN
  Administered 2022-10-31 – 2022-11-02 (×6): 10 mg via ORAL
  Filled 2022-10-31 (×6): qty 2

## 2022-10-31 MED ORDER — HYDROMORPHONE HCL 1 MG/ML IJ SOLN
INTRAMUSCULAR | Status: AC
  Filled 2022-10-31: qty 1

## 2022-10-31 MED ORDER — LIDOCAINE 2% (20 MG/ML) 5 ML SYRINGE
INTRAMUSCULAR | Status: DC | PRN
  Administered 2022-10-31: 60 mg via INTRAVENOUS

## 2022-10-31 MED ORDER — PROPOFOL 10 MG/ML IV BOLUS
INTRAVENOUS | Status: AC
  Filled 2022-10-31: qty 20

## 2022-10-31 MED ORDER — MORPHINE SULFATE (PF) 4 MG/ML IV SOLN
4.0000 mg | Freq: Once | INTRAVENOUS | Status: AC
  Administered 2022-10-31: 4 mg via INTRAVENOUS
  Filled 2022-10-31: qty 1

## 2022-10-31 MED ORDER — CHLORHEXIDINE GLUCONATE CLOTH 2 % EX PADS: 6.0000 | MEDICATED_PAD | Freq: Once | CUTANEOUS | Status: DC

## 2022-10-31 MED ORDER — 0.9 % SODIUM CHLORIDE (POUR BTL) OPTIME
TOPICAL | Status: DC | PRN
  Administered 2022-10-31: 1000 mL

## 2022-10-31 MED ORDER — LIDOCAINE 2% (20 MG/ML) 5 ML SYRINGE
INTRAMUSCULAR | Status: AC
  Filled 2022-10-31: qty 15

## 2022-10-31 MED ORDER — PROPOFOL 10 MG/ML IV BOLUS
INTRAVENOUS | Status: DC | PRN
Start: 2022-10-31 — End: 2022-10-31
  Administered 2022-10-31: 200 mg via INTRAVENOUS

## 2022-10-31 MED ORDER — ONDANSETRON HCL 4 MG/2ML IJ SOLN
4.0000 mg | Freq: Four times a day (QID) | INTRAMUSCULAR | Status: DC | PRN
  Administered 2022-11-01: 4 mg via INTRAVENOUS
  Filled 2022-10-31: qty 2

## 2022-10-31 MED ORDER — BUPIVACAINE-EPINEPHRINE 0.25% -1:200000 IJ SOLN
INTRAMUSCULAR | Status: DC | PRN
  Administered 2022-10-31: 30 mL

## 2022-10-31 MED ORDER — ONDANSETRON 4 MG PO TBDP: 4.0000 mg | ORAL_TABLET | Freq: Four times a day (QID) | ORAL | Status: DC | PRN

## 2022-10-31 MED ORDER — IOHEXOL 300 MG/ML  SOLN
100.0000 mL | Freq: Once | INTRAMUSCULAR | Status: AC | PRN
  Administered 2022-10-31: 100 mL via INTRAVENOUS

## 2022-10-31 MED ORDER — OXYCODONE HCL 5 MG PO TABS: 5.0000 mg | ORAL_TABLET | Freq: Once | ORAL | Status: DC | PRN

## 2022-10-31 MED ORDER — FENTANYL CITRATE (PF) 250 MCG/5ML IJ SOLN
INTRAMUSCULAR | Status: AC
  Filled 2022-10-31: qty 5

## 2022-10-31 MED ORDER — BUPROPION HCL ER (XL) 300 MG PO TB24
300.0000 mg | ORAL_TABLET | Freq: Every day | ORAL | Status: DC
  Administered 2022-11-01 – 2022-11-02 (×2): 300 mg via ORAL
  Filled 2022-10-31 (×3): qty 1

## 2022-10-31 MED ORDER — HYDROMORPHONE HCL 1 MG/ML IJ SOLN
INTRAMUSCULAR | Status: DC | PRN
Start: 2022-10-31 — End: 2022-10-31
  Administered 2022-10-31: .5 mg via INTRAVENOUS

## 2022-10-31 MED ORDER — ORAL CARE MOUTH RINSE: 15.0000 mL | Freq: Once | OROMUCOSAL | Status: AC

## 2022-10-31 MED ORDER — MIDAZOLAM HCL 2 MG/2ML IJ SOLN: 0.5000 mg | Freq: Once | INTRAMUSCULAR | Status: DC | PRN

## 2022-10-31 MED ORDER — SUGAMMADEX SODIUM 200 MG/2ML IV SOLN
INTRAVENOUS | Status: DC | PRN
  Administered 2022-10-31: 240 mg via INTRAVENOUS

## 2022-10-31 MED ORDER — DOCUSATE SODIUM 100 MG PO CAPS
100.0000 mg | ORAL_CAPSULE | Freq: Two times a day (BID) | ORAL | Status: DC
  Administered 2022-10-31 – 2022-11-02 (×4): 100 mg via ORAL
  Filled 2022-10-31 (×4): qty 1

## 2022-10-31 MED ORDER — DIPHENHYDRAMINE HCL 50 MG/ML IJ SOLN: 25.0000 mg | Freq: Four times a day (QID) | INTRAMUSCULAR | Status: DC | PRN

## 2022-10-31 MED ORDER — HYDROXYZINE HCL 25 MG PO TABS: 50.0000 mg | ORAL_TABLET | Freq: Three times a day (TID) | ORAL | Status: DC | PRN

## 2022-10-31 SURGICAL SUPPLY — 57 items
ADH SKN CLS APL DERMABOND .7 (GAUZE/BANDAGES/DRESSINGS) ×1
APL PRP STRL LF DISP 70% ISPRP (MISCELLANEOUS) ×1
APPLIER CLIP 5 13 M/L LIGAMAX5 (MISCELLANEOUS)
APR CLP MED LRG 5 ANG JAW (MISCELLANEOUS)
BAG COUNTER SPONGE SURGICOUNT (BAG) ×1 IMPLANT
BAG SPNG CNTER NS LX DISP (BAG) ×1
BLADE CLIPPER SURG (BLADE) IMPLANT
CANISTER SUCT 3000ML PPV (MISCELLANEOUS) ×1 IMPLANT
CHLORAPREP W/TINT 26 (MISCELLANEOUS) ×1 IMPLANT
CLIP APPLIE 5 13 M/L LIGAMAX5 (MISCELLANEOUS) IMPLANT
COVER SURGICAL LIGHT HANDLE (MISCELLANEOUS) ×1 IMPLANT
CUTTER FLEX LINEAR 45M (STAPLE) IMPLANT
DERMABOND ADVANCED .7 DNX12 (GAUZE/BANDAGES/DRESSINGS) ×1 IMPLANT
ELECT REM PT RETURN 9FT ADLT (ELECTROSURGICAL) ×1
ELECTRODE REM PT RTRN 9FT ADLT (ELECTROSURGICAL) ×1 IMPLANT
ENDOLOOP SUT PDS II 0 18 (SUTURE) IMPLANT
GLOVE BIOGEL PI IND STRL 6 (GLOVE) IMPLANT
GLOVE BIOGEL PI MICRO STRL 5.5 (GLOVE) ×1 IMPLANT
GLOVE SURG UNDER POLY LF SZ6 (GLOVE) ×1 IMPLANT
GOWN STRL REUS W/ TWL LRG LVL3 (GOWN DISPOSABLE) ×3 IMPLANT
GOWN STRL REUS W/TWL LRG LVL3 (GOWN DISPOSABLE) ×3
IRRIG SUCT STRYKERFLOW 2 WTIP (MISCELLANEOUS) ×1
IRRIGATION SUCT STRKRFLW 2 WTP (MISCELLANEOUS) IMPLANT
KIT BASIN OR (CUSTOM PROCEDURE TRAY) ×1 IMPLANT
KIT TURNOVER KIT B (KITS) ×1 IMPLANT
NDL INSUFFLATION 14GA 120MM (NEEDLE) IMPLANT
NEEDLE INSUFFLATION 14GA 120MM (NEEDLE) ×1 IMPLANT
NS IRRIG 1000ML POUR BTL (IV SOLUTION) ×1 IMPLANT
PAD ARMBOARD 7.5X6 YLW CONV (MISCELLANEOUS) ×2 IMPLANT
PENCIL BUTTON HOLSTER BLD 10FT (ELECTRODE) ×1 IMPLANT
RELOAD STAPLE 45 3.5 BLU ETS (ENDOMECHANICALS) IMPLANT
RELOAD STAPLE TA45 3.5 REG BLU (ENDOMECHANICALS) IMPLANT
SCISSORS LAP 5X35 DISP (ENDOMECHANICALS) IMPLANT
SET TUBE SMOKE EVAC HIGH FLOW (TUBING) ×1 IMPLANT
SHEARS HARMONIC 36 ACE (MISCELLANEOUS) IMPLANT
SHEARS HARMONIC ACE PLUS 36CM (ENDOMECHANICALS) IMPLANT
SLEEVE Z-THREAD 5X100MM (TROCAR) ×1 IMPLANT
SPECIMEN JAR SMALL (MISCELLANEOUS) ×1 IMPLANT
STAPLER PROXIMATE 75MM BLUE (STAPLE) IMPLANT
SUT MNCRL AB 4-0 PS2 18 (SUTURE) ×1 IMPLANT
SUT PDS AB 1 CT 36 (SUTURE) IMPLANT
SUT SILK 3 0 SH CR/8 (SUTURE) IMPLANT
SUT VIC AB 3-0 SH 27 (SUTURE) ×1
SUT VIC AB 3-0 SH 27XBRD (SUTURE) IMPLANT
SYS BAG RETRIEVAL 10MM (BASKET) ×1
SYS LAPSCP GELPORT 120MM (MISCELLANEOUS) ×1
SYSTEM BAG RETRIEVAL 10MM (BASKET) ×1 IMPLANT
SYSTEM LAPSCP GELPORT 120MM (MISCELLANEOUS) IMPLANT
TOWEL GREEN STERILE (TOWEL DISPOSABLE) ×1 IMPLANT
TOWEL GREEN STERILE FF (TOWEL DISPOSABLE) ×1 IMPLANT
TRAY FOLEY W/BAG SLVR 14FR (SET/KITS/TRAYS/PACK) IMPLANT
TRAY LAPAROSCOPIC MC (CUSTOM PROCEDURE TRAY) ×1 IMPLANT
TROCAR BALLN 12MMX100 BLUNT (TROCAR) ×1 IMPLANT
TROCAR Z THREAD OPTICAL 12X100 (TROCAR) IMPLANT
TROCAR Z-THREAD OPTICAL 5X100M (TROCAR) ×1 IMPLANT
WARMER LAPAROSCOPE (MISCELLANEOUS) ×1 IMPLANT
WATER STERILE IRR 1000ML POUR (IV SOLUTION) ×1 IMPLANT

## 2022-10-31 NOTE — ED Notes (Signed)
Patient transported to CT 

## 2022-10-31 NOTE — H&P (Signed)
[name] 29-Apr-1995  [id number].     HPI:  [name] is a 27 yo female who presented to the ED pelvic pain and vaginal discharge. She has been having abdominal pain for about 2 weeks. She had a negative workup by her ob/gyn and was referred to the ED. She has had nausea but no vomiting, fevers or diarrhea. WBC is normal. A CT scan showed acute appendicitis. General surgery was consulted.    She has not had any prior abdominal surgeries.  ROS: Review of Systems  Constitutional:  Negative for chills and fever.  Respiratory:  Negative for shortness of breath.   Gastrointestinal:  Positive for abdominal pain and nausea. Negative for diarrhea and vomiting.    Family History  Problem Relation Age of Onset   COPD Mother    Depression Mother    Carpal tunnel syndrome Father    Diabetes Brother    Bipolar disorder Maternal Grandmother     Past Medical History:  Diagnosis Date   Anxiety    Depression    Diabetes mellitus without complication (HCC)    Headache    Keratoconus of left eye    Mental disorder    Obesity     Past Surgical History:  Procedure Laterality Date   CORNEAL TRANSPLANT  2016   per pt report   WISDOM TOOTH EXTRACTION  Oct 2012   all four    Social History:  reports that she has quit smoking. Her smoking use included cigarettes. She started smoking about 2 years ago. She has a 1 pack-year smoking history. She has never used smokeless tobacco. She reports that she does not drink alcohol and does not use drugs.  Allergies:  Allergies  Allergen Reactions   Shrimp [Shellfish Allergy] Anaphylaxis   Amoxicillin    Clindamycin/Lincomycin Hives   Doxycycline Rash    Medications Prior to Admission  Medication Sig Dispense Refill   buPROPion (WELLBUTRIN XL) 300 MG 24 hr tablet Take 1 tablet (300 mg total) by mouth daily. For mood 30 tablet 0   cetirizine (ZYRTEC ALLERGY) 10 MG tablet Take 1 tablet (10 mg total) by mouth daily. 30 tablet 0    fluticasone (FLONASE) 50 MCG/ACT nasal spray Place 1 spray into both nostrils daily. 16 g 2   hydrOXYzine (ATARAX/VISTARIL) 50 MG tablet Take 1 tablet (50 mg total) by mouth 3 (three) times daily as needed for anxiety (sleep). 60 tablet 0   ibuprofen (ADVIL) 200 MG tablet Take 600-800 mg by mouth every 6 (six) hours as needed for headache or mild pain.     Melatonin 5 MG TABS Take 1 tablet (5 mg total) by mouth at bedtime. For sleep (Patient taking differently: Take 5 mg by mouth at bedtime as needed (sleep).) 30 tablet 0   methocarbamol (ROBAXIN) 500 MG tablet Take 1 tablet (500 mg total) by mouth 2 (two) times daily. 20 tablet 0   ondansetron (ZOFRAN ODT) 4 MG disintegrating tablet Take 1 tablet (4 mg total) by mouth every 8 (eight) hours as needed for nausea or vomiting. 20 tablet 0   prednisoLONE acetate (PRED FORTE) 1 % ophthalmic suspension Place 1 drop into the right eye daily.     sertraline (ZOLOFT) 50 MG tablet Take 1 tablet (50 mg total) by mouth daily. For mood (Patient taking differently: Take 50 mg by mouth daily.) 30 tablet 0   triamcinolone (KENALOG) 0.1 % Apply 1 application topically 2 (two) times daily. (Patient taking differently: Apply 1 application  topically 2 (two) times daily as needed (dry/itchy skin).) 453.6 g 0     Physical Exam: Blood pressure (!) 146/80, pulse 89, resp. rate 17, SpO2 97%, unknown if currently breastfeeding. General: resting comfortably, appears stated age, no apparent distress Neurological: alert and oriented, no focal deficits HEENT: normocephalic, atraumatic CV: regular rate and rhythm Respiratory: normal work of breathing on room air Abdomen: soft, nondistended, mild periumbilical tenderness to palpation. Extremities: warm and well-perfused, no deformities, moving all extremities spontaneously Psychiatric: normal mood and affect Skin: warm and dry, no jaundice, no rashes or lesions   Results for orders placed or performed during the hospital  encounter of 10/31/22 (from the past 48 hour(s))  Glucose, capillary     Status: Abnormal   Collection Time: 10/31/22  7:07 PM  Result Value Ref Range   Glucose-Capillary 111 (H) 70 - 99 mg/dL    Comment: Glucose reference range applies only to samples taken after fasting for at least 8 hours.   CT ABDOMEN PELVIS W CONTRAST  Result Date: 10/31/2022 CLINICAL DATA:  Right lower quadrant abdominal pain. EXAM: CT ABDOMEN AND PELVIS WITH CONTRAST TECHNIQUE: Multidetector CT imaging of the abdomen and pelvis was performed using the standard protocol following bolus administration of intravenous contrast. RADIATION DOSE REDUCTION: This exam was performed according to the departmental dose-optimization program which includes automated exposure control, adjustment of the mA and/or kV according to patient size and/or use of iterative reconstruction technique. CONTRAST:  OMNIPAQUE IOHEXOL 300 MG/ML  SOLN COMPARISON:  None Available. FINDINGS: Lower chest: Clear lung bases. Hepatobiliary: No focal liver abnormality is seen. No gallstones, gallbladder wall thickening, or biliary dilatation. Pancreas: Unremarkable. Spleen: Unremarkable. Adrenals/Urinary Tract: Unremarkable adrenal glands. No evidence of a renal mass, calculi, or hydronephrosis. Nondistended bladder. Stomach/Bowel: The stomach is unremarkable. There is no evidence of bowel obstruction. The appendix is diffusely thick-walled and hyperenhancing, measuring up to 1.3 cm in diameter. There is moderate to prominent surrounding inflammation, particularly about the distal aspect of the appendix with extension into the right adnexal region. No extraluminal gas or fluid collection or appendicolith is identified. Vascular/Lymphatic: Normal caliber of the abdominal aorta. Subcentimeter right lower quadrant mesenteric lymph nodes, likely reactive. Reproductive: IUD in place.  No adnexal mass. Other: No ascites. Musculoskeletal: No acute osseous abnormality or  suspicious osseous lesion. IMPRESSION: Acute appendicitis. Moderate to prominent regional inflammation without organized collection. Electronically Signed   By: Sebastian Ache M.D.   On: 10/31/2022 16:18      Assessment/Plan 27 yo female with acute appendicitis without perforation. I personally reviewed her labs, imaging and notes. CT scan shows periappendiceal inflammation consistent with appendicitis. Laparoscopic appendectomy was recommended. I reviewed the benefits and risks of the procedure with the patient, including the risks of bleeding, infection, abscess and appendiceal stump leak. She expressed understanding and agrees to proceed with surgery. Proceed to the OR this evening. Tentatively plan for discharge home postoperatively vs overnight observation. All questions answered.   Sophronia Simas, MD [facility] General, Hepatobiliary and Pancreatic Surgery 10/31/22 7:09 PM

## 2022-10-31 NOTE — Telephone Encounter (Signed)
Called patient in response to [employer].   She reports that pain is intermittent, will start out as a 1 and then quickly escalate to an eight. Pain starts in pelvic area and will radiate up into chest.   She is not currently having chest pain or shortness of breath.   Advised that patient receive evaluation.   Scheduled same day appointment for this afternoon at 1330.  ED precautions discussed.   Veronda Prude, RN

## 2022-10-31 NOTE — ED Provider Notes (Signed)
[place] EMERGENCY DEPARTMENT AT [facility] Provider Note   CSN: [id number] Arrival date & time: 10/31/22  1213     History  Chief Complaint  Patient presents with   Pelvic Pain    [name] is a 27 y.o. female history of ADHD, oppositional defiant disorder, PTSD, bipolar presented for right lower quadrant/pelvic pain for the past 2 weeks.  Patient states that she has been evaluated by OB/GYN and her tests were negative and was referred to ED for further evaluation.  Patient states that the pain starts in the right lower quadrant and goes around her abdomen and at times will become so great that she becomes nauseous.  Patient still has her appendix and gallbladder.  Patient is that she has had thick white discharge from her vagina but had negative wet mount last week.  Patient denies chest pain, shortness of breath, fevers, emesis, change in sensation/motor skills, diarrhea, constipation.  Patient is currently on IUD and recently finished her period a few weeks ago.  Home Medications Prior to Admission medications   Medication Sig Start Date End Date Taking? Authorizing Provider  buPROPion (WELLBUTRIN XL) 300 MG 24 hr tablet Take 1 tablet (300 mg total) by mouth daily. For mood 03/16/18   Aldean Baker, NP  cetirizine (ZYRTEC ALLERGY) 10 MG tablet Take 1 tablet (10 mg total) by mouth daily. 05/06/21   Jeannie Fend, PA-C  fluticasone (FLONASE) 50 MCG/ACT nasal spray Place 1 spray into both nostrils daily. 05/06/21   Jeannie Fend, PA-C  hydrOXYzine (ATARAX/VISTARIL) 50 MG tablet Take 1 tablet (50 mg total) by mouth 3 (three) times daily as needed for anxiety (sleep). 03/15/18   Aldean Baker, NP  ibuprofen (ADVIL) 200 MG tablet Take 600-800 mg by mouth every 6 (six) hours as needed for headache or mild pain.    [provider]  Melatonin 5 MG TABS Take 1 tablet (5 mg total) by mouth at bedtime. For sleep Patient taking differently: Take 5 mg by mouth at bedtime  as needed (sleep). 03/15/18   Aldean Baker, NP  methocarbamol (ROBAXIN) 500 MG tablet Take 1 tablet (500 mg total) by mouth 2 (two) times daily. 09/25/20   Gailen Shelter, PA  ondansetron (ZOFRAN ODT) 4 MG disintegrating tablet Take 1 tablet (4 mg total) by mouth every 8 (eight) hours as needed for nausea or vomiting. 09/25/20   Gailen Shelter, PA  prednisoLONE acetate (PRED FORTE) 1 % ophthalmic suspension Place 1 drop into the right eye daily. 12/30/17   [provider]  sertraline (ZOLOFT) 50 MG tablet Take 1 tablet (50 mg total) by mouth daily. For mood Patient taking differently: Take 50 mg by mouth daily. 03/16/18   Aldean Baker, NP  triamcinolone (KENALOG) 0.1 % Apply 1 application topically 2 (two) times daily. Patient taking differently: Apply 1 application topically 2 (two) times daily as needed (dry/itchy skin). 03/20/20   Jeannie Fend, PA-C      Allergies    Shrimp [shellfish allergy], Amoxicillin, Clindamycin/lincomycin, and Doxycycline    Review of Systems   Review of Systems  Genitourinary:  Positive for pelvic pain.    Physical Exam Updated Vital Signs BP 105/64   Pulse 83   Temp 97.8 F (36.6 C) (Oral)   Resp 18   Ht 5\' 5"  (1.651 m)   Wt 118.8 kg   SpO2 97%   BMI 43.60 kg/m  Physical Exam Vitals reviewed.  Constitutional:  General: She is not in acute distress. HENT:     Head: Normocephalic and atraumatic.  Eyes:     Extraocular Movements: Extraocular movements intact.     Conjunctiva/sclera: Conjunctivae normal.     Pupils: Pupils are equal, round, and reactive to light.  Cardiovascular:     Rate and Rhythm: Normal rate and regular rhythm.     Pulses: Normal pulses.     Heart sounds: Normal heart sounds.     Comments: 2+ bilateral radial/dorsalis pedis pulses with regular rate Pulmonary:     Effort: Pulmonary effort is normal. No respiratory distress.     Breath sounds: Normal breath sounds.  Abdominal:     Palpations: Abdomen is  soft.     Tenderness: There is abdominal tenderness (Generalized). There is no guarding or rebound.  Musculoskeletal:        General: Normal range of motion.     Cervical back: Normal range of motion and neck supple.     Comments: 5 out of 5 bilateral grip/leg extension strength  Skin:    General: Skin is warm and dry.     Capillary Refill: Capillary refill takes less than 2 seconds.  Neurological:     General: No focal deficit present.     Mental Status: She is alert and oriented to person, place, and time.     Comments: Sensation intact in all 4 limbs  Psychiatric:        Mood and Affect: Mood normal.     ED Results / Procedures / Treatments   Labs (all labs ordered are listed, but only abnormal results are displayed) Labs Reviewed  CBC WITH DIFFERENTIAL/PLATELET - Abnormal; Notable for the following components:      Result Value   Platelets 431 (*)    Abs Immature Granulocytes 0.08 (*)    All other components within normal limits  COMPREHENSIVE METABOLIC PANEL - Abnormal; Notable for the following components:   Glucose, Bld 288 (*)    AST 13 (*)    All other components within normal limits  URINALYSIS, ROUTINE W REFLEX MICROSCOPIC - Abnormal; Notable for the following components:   Protein, ur TRACE (*)    Leukocytes,Ua LARGE (*)    Bacteria, UA RARE (*)    All other components within normal limits  LIPASE, BLOOD  PREGNANCY, URINE    EKG None  Radiology CT ABDOMEN PELVIS W CONTRAST  Result Date: 10/31/2022 CLINICAL DATA:  Right lower quadrant abdominal pain. EXAM: CT ABDOMEN AND PELVIS WITH CONTRAST TECHNIQUE: Multidetector CT imaging of the abdomen and pelvis was performed using the standard protocol following bolus administration of intravenous contrast. RADIATION DOSE REDUCTION: This exam was performed according to the departmental dose-optimization program which includes automated exposure control, adjustment of the mA and/or kV according to patient size and/or  use of iterative reconstruction technique. CONTRAST:  OMNIPAQUE IOHEXOL 300 MG/ML  SOLN COMPARISON:  None Available. FINDINGS: Lower chest: Clear lung bases. Hepatobiliary: No focal liver abnormality is seen. No gallstones, gallbladder wall thickening, or biliary dilatation. Pancreas: Unremarkable. Spleen: Unremarkable. Adrenals/Urinary Tract: Unremarkable adrenal glands. No evidence of a renal mass, calculi, or hydronephrosis. Nondistended bladder. Stomach/Bowel: The stomach is unremarkable. There is no evidence of bowel obstruction. The appendix is diffusely thick-walled and hyperenhancing, measuring up to 1.3 cm in diameter. There is moderate to prominent surrounding inflammation, particularly about the distal aspect of the appendix with extension into the right adnexal region. No extraluminal gas or fluid collection or appendicolith is identified. Vascular/Lymphatic:  Normal caliber of the abdominal aorta. Subcentimeter right lower quadrant mesenteric lymph nodes, likely reactive. Reproductive: IUD in place.  No adnexal mass. Other: No ascites. Musculoskeletal: No acute osseous abnormality or suspicious osseous lesion. IMPRESSION: Acute appendicitis. Moderate to prominent regional inflammation without organized collection. Electronically Signed   By: Sebastian Ache M.D.   On: 10/31/2022 16:18    Procedures Procedures    Medications Ordered in ED Medications  sodium chloride 0.9 % bolus 1,000 mL (1,000 mLs Intravenous New Bag/Given 10/31/22 1429)  iohexol (OMNIPAQUE) 300 MG/ML solution 100 mL (100 mLs Intravenous Contrast Given 10/31/22 1400)    ED Course/ Medical Decision Making/ A&P                                 Medical Decision Making Amount and/or Complexity of Data Reviewed Labs: ordered. Radiology: ordered.  Risk Prescription drug management.   Lynnell Catalan 27 y.o. presented today for lower abdominal/pelvic pain. Working DDx that I considered at this time includes, but not  limited to, gastroenteritis, colitis, small bowel obstruction, appendicitis, cholecystitis, hepatobiliary pathology, gastritis, PUD, ACS, aortic dissection pancreatitis, nephrolithiasis, AAA, UTI, pyelonephritis, ruptured ectopic pregnancy, PID, ovarian torsion.  R/o DDx: gastroenteritis, colitis, small bowel obstruction, cholecystitis, hepatobiliary pathology, gastritis, PUD, ACS, aortic dissection pancreatitis, nephrolithiasis, AAA, UTI, pyelonephritis, ruptured ectopic pregnancy, PID, ovarian torsion: These are considered less likely due to history of present illness, physical exam, labs/imaging findings.  Review of prior external notes: 10/24/2022 office visit  Unique Tests and My Interpretation:  CBC with differential: Unremarkable CMP: Hyperglycemia 288 Lipase: Unremarkable UA: Unremarkable Urine Pregnancy: Negative CT Abd/Pelvis with contrast: Acute appendicitis  Discussion with Independent Historian: None  Discussion of Management of Tests:  Freida Busman, MD General Surgery  Risk: High: hospitalization or escalation of hospital-level care  Risk Stratification Score: none  Plan: On exam patient was in no acute distress with stable vitals.  On exam patient did have generalized tenderness on exam but stated that was mostly in the right lower quadrant.  Patient did have negative Rovsing and psoas sign and did not show any peritoneal signs.  I spoke to the patient about whether or not to do a CT scan as patient's exams are physical exam was reassuring and patient was not endorsing any infectious symptoms and after discussion patient stated that she wanted the CT scan to take a look at her appendix.  Will also order pelvic ultrasound as well as we do not have a transvaginal probe at this time.  Patient not requiring any pain meds and nausea meds.  Upon chart review it appears patient did have reassuring OB/GYN results and we agreed to forego a pelvic exam and swabs again as they were negative a  week ago.  CT came back positive for acute appendicitis.  Ultrasound was canceled.  General surgery be consulted and anticipate admission.  Will start IV antibiotics.  Patient does have allergy to amoxicillin that causes hives and when I spoke to the patient about how amoxicillin and Rocephin are Cousins of each other.  Stated that she was okay with trying the Rocephin and so we will order this.  Patient is also not currently lactating and so Flagyl should be okay.  Still waiting on general surgery to call back.  Patient last ate at 3:30 PM when her mom brought her granola bar.  I spoke to the general surgeon on-call and she recommends patient Drives to [facility]  and check in ER and say there for scheduled surgery and she should go straight up as this will be faster than transporting via [facility].  Patient does have a family member that can drive her and I spoke to the patient extensively about this plan and patient states that she and is in agreement to be discharged and drive straight to [facility].  This will be placed in her discharge papers for the ER front desk.  At time of discharge patient is stable.  This chart was dictated using voice recognition software.  Despite best efforts to proofread,  errors can occur which can change the documentation meaning.         Final Clinical Impression(s) / ED Diagnoses Final diagnoses:  Acute appendicitis, unspecified acute appendicitis type    Rx / DC Orders ED Discharge Orders     None         Remi Deter 10/31/22 1802    Virgina Norfolk, DO 11/04/22 [phone]

## 2022-10-31 NOTE — Transfer of Care (Signed)
Immediate Anesthesia Transfer of Care Note  Patient: [name]  Procedure(s) Performed: APPENDECTOMY LAPAROSCOPIC (Abdomen)  Patient Location: PACU  Anesthesia Type:General  Level of Consciousness: awake and alert   Airway & Oxygen Therapy: Patient Spontanous Breathing and Patient connected to nasal cannula oxygen  Post-op Assessment: Report given to RN and Post -op Vital signs reviewed and stable  Post vital signs: Reviewed and stable  Last Vitals:  Vitals Value Taken Time  BP 128/81 10/31/22 2113  Temp    Pulse 102 10/31/22 2116  Resp 26 10/31/22 2116  SpO2 95 % 10/31/22 2116  Vitals shown include unfiled device data.  Last Pain:  Vitals:   10/31/22 1906  PainSc: 6          Complications: No notable events documented.

## 2022-10-31 NOTE — Anesthesia Procedure Notes (Signed)
Procedure Name: Intubation Date/Time: 10/31/2022 7:34 PM  Performed by: Rachel Moulds, CRNAPre-anesthesia Checklist: Emergency Drugs available, Patient identified, Suction available, Patient being monitored and Timeout performed Patient Re-evaluated:Patient Re-evaluated prior to induction Oxygen Delivery Method: Circle system utilized Preoxygenation: Pre-oxygenation with 100% oxygen Induction Type: IV induction, Rapid sequence and Cricoid Pressure applied Laryngoscope Size: Mac and 3 Grade View: Grade I Tube type: Oral Tube size: 7.0 mm Number of attempts: 1 Airway Equipment and Method: Stylet Placement Confirmation: ETT inserted through vocal cords under direct vision, positive ETCO2, CO2 detector and breath sounds checked- equal and bilateral Secured at: 21 cm Tube secured with: Tape Dental Injury: Teeth and Oropharynx as per pre-operative assessment

## 2022-10-31 NOTE — Anesthesia Postprocedure Evaluation (Signed)
Anesthesia Post Note  Patient: [name]  Procedure(s) Performed: APPENDECTOMY LAPAROSCOPIC (Abdomen)     Patient location during evaluation: PACU Anesthesia Type: General Level of consciousness: awake and alert, patient cooperative and oriented Pain management: pain level controlled Vital Signs Assessment: post-procedure vital signs reviewed and stable Respiratory status: spontaneous breathing, nonlabored ventilation and respiratory function stable Cardiovascular status: blood pressure returned to baseline and stable Postop Assessment: no apparent nausea or vomiting Anesthetic complications: no   No notable events documented.  Last Vitals:  Vitals:   10/31/22 2200 10/31/22 2215  BP: 119/84 (!) 157/82  Pulse: 86 91  Resp: 15 16  Temp:  36.5 C  SpO2: 95% 97%    Last Pain:  Vitals:   10/31/22 2215  PainSc: Asleep                 Jonte Wollam,E. Sotirios Navarro

## 2022-10-31 NOTE — Anesthesia Preprocedure Evaluation (Addendum)
Anesthesia Evaluation  Patient identified by MRN, date of birth, ID band Patient awake    Reviewed: Allergy & Precautions, NPO status , Patient's Chart, lab work & pertinent test results  History of Anesthesia Complications Negative for: history of anesthetic complications  Airway Mallampati: I  TM Distance: >3 FB Neck ROM: Full    Dental  (+) Teeth Intact, Dental Advisory Given   Pulmonary former smoker   breath sounds clear to auscultation       Cardiovascular (-) hypertensionnegative cardio ROS  Rhythm:Regular Rate:Normal     Neuro/Psych  PSYCHIATRIC DISORDERS (ADHD) Anxiety Depression Bipolar Disorder   negative neurological ROS     GI/Hepatic Neg liver ROS,,,Abd pain and nausea with acute appy   Endo/Other  diabetes (glu 111), Oral Hypoglycemic Agents  BMI 43.6  Renal/GU negative Renal ROS     Musculoskeletal   Abdominal   Peds  Hematology negative hematology ROS (+)   Anesthesia Other Findings   Reproductive/Obstetrics                             Anesthesia Physical Anesthesia Plan  ASA: 3  Anesthesia Plan: General   Post-op Pain Management: Ofirmev IV (intra-op)*   Induction: Intravenous and Rapid sequence  PONV Risk Score and Plan: 3 and Ondansetron, Dexamethasone and Scopolamine patch - Pre-op  Airway Management Planned: Oral ETT  Additional Equipment: None  Intra-op Plan:   Post-operative Plan: Extubation in OR  Informed Consent: I have reviewed the patients History and Physical, chart, labs and discussed the procedure including the risks, benefits and alternatives for the proposed anesthesia with the patient or authorized representative who has indicated his/her understanding and acceptance.     Dental advisory given  Plan Discussed with: CRNA and Surgeon  Anesthesia Plan Comments:         Anesthesia Quick Evaluation

## 2022-10-31 NOTE — ED Triage Notes (Signed)
Pt presents with pelivc pain with vaginal bleeding and discharge x 2 weeks. Pt was seen by OB/GYN last week and was referred to ED if symptoms continued.

## 2022-10-31 NOTE — Op Note (Signed)
Date: 10/31/22  Patient: [name] MRN: [id number]  Preoperative Diagnosis: Acute appendicitis Postoperative Diagnosis: Same  Procedure: Laparoscopic-assisted appendectomy  Surgeon: Sophronia Simas, MD  EBL: 20 mL  Anesthesia: General endotracheal  Specimens: Appendix  Indications: [name] is a 27 yo female who presented with 2 weeks of abdominal and pelvic pain, which has been worsening. A CT scan in the ED showed acute appendicitis without perforation. After a discussion of the risks and benefits of surgery, the patient agreed to proceed with appendectomy.  Findings: Acute appendicitis, with adherence of the appendix to the terminal ileal mesentery.  Procedure details: Informed consent was obtained in the preoperative area prior to the procedure. The patient was brought to the operating room and placed on the table in the supine position. General anesthesia was induced and appropriate lines and drains were placed for intraoperative monitoring.  The abdomen was prepped and draped in the usual sterile fashion. A pre-procedure timeout was taken verifying patient identity, surgical site and procedure to be performed.  A vertical infraumbilical skin incision was made, the subcutaneous tissue was bluntly spread, and the umbilical stalk was grasped and elevated.  A Veress needle was inserted through the fascia and intraperitoneal placement was confirmed with the saline drop test.  The abdomen was insufflated and a 5 mm Visiport was placed.  The peritoneal cavity was inspected with no evidence of visceral or vascular injury.  A 5 mm port was placed in the left lower quadrant under direct visualization.  The umbilical port was then upsized to a 12 mm port.  An additional 5 mm port was placed in the suprapubic area, taking care to avoid the bladder.  There was a small amount of turbid free fluid in the pelvis.  There was a loop of small bowel adherent to the right pelvic sidewall.  This was  gently separated using blunt dissection.  It then became apparent that this was the terminal ileum, and the appendix was adherent to the mesentery of the terminal ileum, very close to the small bowel.  The appendix was diffusely dilated, erythematous and firm consistent with appendicitis.  The tip of the appendix was able to be bluntly separated from the mesentery.  However after multiple attempts to bluntly separate the remainder of the appendix from the terminal ileum, the appendix remained densely adherent to the small bowel.  At this point I felt that the patient may need an ileocecectomy.  The right colon was mobilized along the line of Toldt using harmonic shears and blunt dissection, working in a lateral to medial fashion and moving up towards the splenic flexure.  Once the cecum and terminal ileum were mobilized enough to reach the umbilicus, the umbilical port was removed and the abdomen was desufflated.  The umbilical incision was extended slightly superiorly, and the subcutaneous tissue was divided with cautery to expose the fascia.  The fascial incision was extended superiorly, and a small wound protector was placed.  The terminal ileum and cecum were then exteriorized into the wound.  The appendix was then able to be manually separated from the terminal ileum using blunt finger dissection.  The ileum itself was mildly thickened but appeared pink and well-perfused.  The mesentery remained intact with no defects or bleeding.  The mesoappendix was divided with harmonic shears.  The appendix was then transected at the base from the cecum using a GIA 75 mm stapler with a blue load.  The appendix was passed off the field and sent for  routine pathology.  The appendiceal stump was oversewn with 3-0 silk Lembert sutures.  The adjacent Treves ligament was secured in place over the appendiceal stump by loosely tying down the tails of the Lembert sutures.  The bowel was then placed back in the abdomen, and a  GelPort was applied on the wound protector.  The abdomen was insufflated and examined.  The bowel lay in proper orientation.  The surgical site was irrigated and appeared hemostatic.  The ports were removed under direct visualization.  The wound protector was removed and the fascia at the umbilical incision was closed with a running 1 PDS suture.  Scarpa's layer was closed with a running 3-0 Vicryl suture, and the skin was closed with a running subcuticular 4-0 Monocryl suture.  The remaining port sites were also closed with subcuticular 4-0 Monocryl suture.  Dermabond was applied.  The patient tolerated the procedure well with no apparent complications.  All counts were correct x2 at the end of the procedure. The patient was extubated and taken to PACU in stable condition.  Sophronia Simas, MD 10/31/22 9:23 PM

## 2022-10-31 NOTE — Discharge Instructions (Addendum)
Please go straight to the [facility], ER and go to the front desk saying that you are there for scheduled surgery for your appendicitis as you was seen earlier today at [facility] and the general surgeon states that she is getting you prepped for the OR later tonight.  The general surgeon's name is Dr. Sophronia Simas.  If the front desk is any questions they may call Dr. Freida Busman who is on-call or call one of the providers at [facility] [phone].

## 2022-11-01 ENCOUNTER — Encounter (HOSPITAL_COMMUNITY): Payer: Self-pay | Admitting: Surgery

## 2022-11-01 MED ORDER — KETOROLAC TROMETHAMINE 15 MG/ML IJ SOLN: 15.0000 mg | Freq: Four times a day (QID) | INTRAMUSCULAR | Status: DC | PRN

## 2022-11-01 MED ORDER — GABAPENTIN 300 MG PO CAPS
300.0000 mg | ORAL_CAPSULE | Freq: Three times a day (TID) | ORAL | Status: DC
  Administered 2022-11-01 – 2022-11-02 (×5): 300 mg via ORAL
  Filled 2022-11-01 (×5): qty 1

## 2022-11-01 MED ORDER — METHOCARBAMOL 500 MG PO TABS
500.0000 mg | ORAL_TABLET | Freq: Four times a day (QID) | ORAL | Status: DC
  Administered 2022-11-01 – 2022-11-02 (×7): 500 mg via ORAL
  Filled 2022-11-01 (×7): qty 1

## 2022-11-01 NOTE — Progress Notes (Signed)
1 Day Post-Op   Subjective/Chief Complaint: Pain with moving   Objective: Vital signs in last 24 hours: Temp:  [97.3 F (36.3 C)-99 F (37.2 C)] 99 F (37.2 C) (09/28 0759) Pulse Rate:  [83-105] 105 (09/28 0759) Resp:  [12-26] 16 (09/28 0759) BP: (105-157)/(64-92) 121/72 (09/28 0759) SpO2:  [95 %-98 %] 95 % (09/28 0759) Weight:  [161.0 kg] 118.8 kg (09/27 1227) Last BM Date : 10/31/22 (2 hours ago per patient)  Intake/Output from previous day: 09/27 0701 - 09/28 0700 In: 1100 [I.V.:1000; IV Piggyback:100] Out: 25 [Blood:25] Intake/Output this shift: No intake/output data recorded.  A&Ox3 Unlabored resp Abd soft, diffusely ttp, nondistended. Incisions c/d/I with dermabond, no cellulitis or hematoma  Lab Results:  Recent Labs    10/31/22 1236  WBC 10.2  HGB 13.4  HCT 38.2  PLT 431*   BMET Recent Labs    10/31/22 1236  NA 135  K 4.0  CL 103  CO2 23  GLUCOSE 288*  BUN 11  CREATININE 0.44  CALCIUM 9.2   PT/INR No results for input(s): "LABPROT", "INR" in the last 72 hours. ABG No results for input(s): "PHART", "HCO3" in the last 72 hours.  Invalid input(s): "PCO2", "PO2"  Studies/Results: CT ABDOMEN PELVIS W CONTRAST  Result Date: 10/31/2022 CLINICAL DATA:  Right lower quadrant abdominal pain. EXAM: CT ABDOMEN AND PELVIS WITH CONTRAST TECHNIQUE: Multidetector CT imaging of the abdomen and pelvis was performed using the standard protocol following bolus administration of intravenous contrast. RADIATION DOSE REDUCTION: This exam was performed according to the departmental dose-optimization program which includes automated exposure control, adjustment of the mA and/or kV according to patient size and/or use of iterative reconstruction technique. CONTRAST:  OMNIPAQUE IOHEXOL 300 MG/ML  SOLN COMPARISON:  None Available. FINDINGS: Lower chest: Clear lung bases. Hepatobiliary: No focal liver abnormality is seen. No gallstones, gallbladder wall thickening, or  biliary dilatation. Pancreas: Unremarkable. Spleen: Unremarkable. Adrenals/Urinary Tract: Unremarkable adrenal glands. No evidence of a renal mass, calculi, or hydronephrosis. Nondistended bladder. Stomach/Bowel: The stomach is unremarkable. There is no evidence of bowel obstruction. The appendix is diffusely thick-walled and hyperenhancing, measuring up to 1.3 cm in diameter. There is moderate to prominent surrounding inflammation, particularly about the distal aspect of the appendix with extension into the right adnexal region. No extraluminal gas or fluid collection or appendicolith is identified. Vascular/Lymphatic: Normal caliber of the abdominal aorta. Subcentimeter right lower quadrant mesenteric lymph nodes, likely reactive. Reproductive: IUD in place.  No adnexal mass. Other: No ascites. Musculoskeletal: No acute osseous abnormality or suspicious osseous lesion. IMPRESSION: Acute appendicitis. Moderate to prominent regional inflammation without organized collection. Electronically Signed   By: Sebastian Ache M.D.   On: 10/31/2022 16:18    Anti-infectives: Anti-infectives (From admission, onward)    None       Assessment/Plan: s/p Procedure(s): APPENDECTOMY LAPAROSCOPIC (N/A)  Pain control Mobilize Probable DC tomorrow   LOS: 0 days    Berna Bue 11/01/2022

## 2022-11-01 NOTE — Plan of Care (Signed)

## 2022-11-02 LAB — CBC
HCT: 36.1 % (ref 36.0–46.0)
Hemoglobin: 12.1 g/dL (ref 12.0–15.0)
MCH: 30 pg (ref 26.0–34.0)
MCHC: 33.5 g/dL (ref 30.0–36.0)
MCV: 89.4 fL (ref 80.0–100.0)
Platelets: 403 10*3/uL — ABNORMAL HIGH (ref 150–400)
RBC: 4.04 MIL/uL (ref 3.87–5.11)
RDW: 12.4 % (ref 11.5–15.5)
WBC: 11.4 10*3/uL — ABNORMAL HIGH (ref 4.0–10.5)
nRBC: 0 % (ref 0.0–0.2)

## 2022-11-02 LAB — BASIC METABOLIC PANEL
Anion gap: 11 (ref 5–15)
BUN: 12 mg/dL (ref 6–20)
CO2: 23 mmol/L (ref 22–32)
Calcium: 8.4 mg/dL — ABNORMAL LOW (ref 8.9–10.3)
Chloride: 101 mmol/L (ref 98–111)
Creatinine, Ser: 0.58 mg/dL (ref 0.44–1.00)
GFR, Estimated: >60 mL/min (ref >60)
Glucose, Bld: 166 mg/dL — ABNORMAL HIGH (ref 70–99)
Potassium: 3.5 mmol/L (ref 3.5–5.1)
Sodium: 135 mmol/L (ref 135–145)

## 2022-11-02 LAB — MAGNESIUM: Magnesium: 2 mg/dL (ref 1.7–2.4)

## 2022-11-02 MED ORDER — POLYETHYLENE GLYCOL 3350 17 G PO PACK
17.0000 g | PACK | Freq: Every day | ORAL | Status: DC
  Administered 2022-11-02: 17 g via ORAL
  Filled 2022-11-02: qty 1

## 2022-11-02 MED ORDER — OXYCODONE HCL 5 MG PO TABS: 5.0000 mg | ORAL_TABLET | Freq: Four times a day (QID) | ORAL | 0 refills | Status: DC | PRN

## 2022-11-02 MED ORDER — POLYETHYLENE GLYCOL 3350 17 G PO PACK: 17.0000 g | PACK | Freq: Every day | ORAL | Status: DC | PRN

## 2022-11-02 MED ORDER — ACETAMINOPHEN 500 MG PO TABS: 1000.0000 mg | ORAL_TABLET | Freq: Four times a day (QID) | ORAL | Status: AC | PRN

## 2022-11-02 MED ORDER — DOCUSATE SODIUM 100 MG PO CAPS: 100.0000 mg | ORAL_CAPSULE | Freq: Two times a day (BID) | ORAL | Status: DC

## 2022-11-02 MED ORDER — METHOCARBAMOL 500 MG PO TABS: 500.0000 mg | ORAL_TABLET | Freq: Four times a day (QID) | ORAL | 0 refills | Status: DC | PRN

## 2022-11-02 NOTE — Progress Notes (Signed)
Patient to discharge later this evening, she does not have a ride prior to that.   Juliet Rude, [facility] 11/02/2022, 11:40 AM Please see Amion for pager number during day hours 7:00am-4:30pm

## 2022-11-02 NOTE — Progress Notes (Signed)
Patient called On call PA, patient asked to delay discharge until this evening. PA Tresa Endo called back and was agreeable to patient request.

## 2022-11-02 NOTE — Discharge Summary (Signed)
[facility] Discharge Summary   Patient ID: [name] MRN: [id number] DOB/AGE: [birth year] 27 y.o.  Admit date: 10/31/2022 Discharge date: 11/02/2022  Admitting Diagnosis: Acute appendicitis   Discharge Diagnosis S/p laparoscopic appendectomy   Consultants None   Imaging: CT ABDOMEN PELVIS W CONTRAST  Result Date: 10/31/2022 CLINICAL DATA:  Right lower quadrant abdominal pain. EXAM: CT ABDOMEN AND PELVIS WITH CONTRAST TECHNIQUE: Multidetector CT imaging of the abdomen and pelvis was performed using the standard protocol following bolus administration of intravenous contrast. RADIATION DOSE REDUCTION: This exam was performed according to the departmental dose-optimization program which includes automated exposure control, adjustment of the mA and/or kV according to patient size and/or use of iterative reconstruction technique. CONTRAST:  OMNIPAQUE IOHEXOL 300 MG/ML  SOLN COMPARISON:  None Available. FINDINGS: Lower chest: Clear lung bases. Hepatobiliary: No focal liver abnormality is seen. No gallstones, gallbladder wall thickening, or biliary dilatation. Pancreas: Unremarkable. Spleen: Unremarkable. Adrenals/Urinary Tract: Unremarkable adrenal glands. No evidence of a renal mass, calculi, or hydronephrosis. Nondistended bladder. Stomach/Bowel: The stomach is unremarkable. There is no evidence of bowel obstruction. The appendix is diffusely thick-walled and hyperenhancing, measuring up to 1.3 cm in diameter. There is moderate to prominent surrounding inflammation, particularly about the distal aspect of the appendix with extension into the right adnexal region. No extraluminal gas or fluid collection or appendicolith is identified. Vascular/Lymphatic: Normal caliber of the abdominal aorta. Subcentimeter right lower quadrant mesenteric lymph nodes, likely reactive. Reproductive: IUD in place.  No adnexal mass. Other: No ascites. Musculoskeletal: No acute osseous abnormality  or suspicious osseous lesion. IMPRESSION: Acute appendicitis. Moderate to prominent regional inflammation without organized collection. Electronically Signed   By: Sebastian Ache M.D.   On: 10/31/2022 16:18    Procedures Dr. Sophronia Simas (10/31/22) - Laparoscopic Appendectomy  Hospital Course:  Patient is a 27 year old female who presented to the ED with abdominal. Workup showed acute appendicitis.  Patient was admitted and underwent procedure listed above.  Tolerated procedure well and was transferred to the floor.  Diet was advanced as tolerated.  On POD2, the patient was voiding well, tolerating diet, ambulating well, pain well controlled, vital signs stable, incisions c/d/i and felt stable for discharge home.  Patient will follow up in our office in 3 weeks and knows to call with questions or concerns. She will call to confirm appointment date/time.    Physical Exam: General:  Alert, NAD, pleasant, comfortable Abd:  Soft, ND, mild tenderness, incisions C/D/I  I or a member of my team have reviewed this patient in the Controlled Substance Database.   Allergies as of 11/02/2022       Reactions   Shrimp [shellfish Allergy] Anaphylaxis   Amoxil [amoxicillin] Other (See Comments)   Unknown reaction   Cleocin [clindamycin] Hives   Vibramycin [doxycycline] Rash        Medication List     TAKE these medications    acetaminophen 500 MG tablet Commonly known as: TYLENOL Take 2 tablets (1,000 mg total) by mouth every 6 (six) hours as needed.   buPROPion 300 MG 24 hr tablet Commonly known as: WELLBUTRIN XL Take 1 tablet (300 mg total) by mouth daily. For mood   docusate sodium 100 MG capsule Commonly known as: COLACE Take 1 capsule (100 mg total) by mouth 2 (two) times daily.   hydrOXYzine 50 MG tablet Commonly known as: ATARAX Take 1 tablet (50 mg total) by mouth 3 (three) times daily as needed for anxiety (sleep).  ibuprofen 200 MG tablet Commonly known as: ADVIL Take 600 mg  by mouth 2 (two) times daily as needed for headache, moderate pain or mild pain.   methocarbamol 500 MG tablet Commonly known as: ROBAXIN Take 1 tablet (500 mg total) by mouth every 6 (six) hours as needed for muscle spasms.   oxyCODONE 5 MG immediate release tablet Commonly known as: Oxy IR/ROXICODONE Take 1-2 tablets (5-10 mg total) by mouth every 6 (six) hours as needed for moderate pain or severe pain.   polyethylene glycol 17 g packet Commonly known as: MIRALAX / GLYCOLAX Take 17 g by mouth daily as needed for mild constipation.   prednisoLONE acetate 1 % ophthalmic suspension Commonly known as: PRED FORTE Place 1 drop into both eyes daily.   sertraline 50 MG tablet Commonly known as: ZOLOFT Take 1 tablet (50 mg total) by mouth daily. For mood What changed: additional instructions          Follow-up Information     Maczis, Hedda Slade, PA-C. Call.   Specialty: General Surgery Why: Call to confirm appointment date/time in about 3 weeks for post-op follow up. Please arrive 30 min priro to appointment time to check in. Contact information: [address] [place] [address] [facility] [place] [address] [phone]                 Signed: Juliet Rude , [facility] 11/02/2022, 11:42 AM Please see Amion for pager number during day hours 7:00am-4:30pm

## 2022-11-02 NOTE — Discharge Instructions (Signed)

## 2022-11-02 NOTE — Progress Notes (Signed)
AVS and Letter given to patient, awaiting for ride.

## 2022-11-02 NOTE — Plan of Care (Signed)

## 2022-11-04 LAB — SURGICAL PATHOLOGY

## 2022-11-07 ENCOUNTER — Other Ambulatory Visit: Payer: Self-pay | Admitting: Surgery

## 2022-11-20 ENCOUNTER — Encounter: Payer: Self-pay | Admitting: Student

## 2022-11-20 ENCOUNTER — Ambulatory Visit (INDEPENDENT_AMBULATORY_CARE_PROVIDER_SITE_OTHER): Payer: Medicaid Other | Admitting: Student

## 2022-11-20 VITALS — BP 130/76 | HR 92 | Ht 65.0 in | Wt 260.0 lb

## 2022-11-20 DIAGNOSIS — Z1159 Encounter for screening for other viral diseases: Secondary | ICD-10-CM | POA: Diagnosis not present

## 2022-11-20 DIAGNOSIS — Z6841 Body Mass Index (BMI) 40.0 and over, adult: Secondary | ICD-10-CM

## 2022-11-20 DIAGNOSIS — Z7984 Long term (current) use of oral hypoglycemic drugs: Secondary | ICD-10-CM

## 2022-11-20 DIAGNOSIS — E669 Obesity, unspecified: Secondary | ICD-10-CM | POA: Insufficient documentation

## 2022-11-20 DIAGNOSIS — E119 Type 2 diabetes mellitus without complications: Secondary | ICD-10-CM

## 2022-11-20 DIAGNOSIS — Z0000 Encounter for general adult medical examination without abnormal findings: Secondary | ICD-10-CM | POA: Diagnosis present

## 2022-11-20 DIAGNOSIS — H18603 Keratoconus, unspecified, bilateral: Secondary | ICD-10-CM | POA: Diagnosis not present

## 2022-11-20 DIAGNOSIS — H18609 Keratoconus, unspecified, unspecified eye: Secondary | ICD-10-CM | POA: Insufficient documentation

## 2022-11-20 DIAGNOSIS — E66813 Obesity, class 3: Secondary | ICD-10-CM | POA: Diagnosis not present

## 2022-11-20 LAB — POCT GLYCOSYLATED HEMOGLOBIN (HGB A1C): HbA1c, POC (controlled diabetic range): 8.1 % — AB (ref 0.0–7.0)

## 2022-11-20 NOTE — Assessment & Plan Note (Signed)
Scheduled for follow-up to discuss weight loss counseling.

## 2022-11-20 NOTE — Patient Instructions (Signed)
It was great to see you! Thank you for allowing me to participate in your care!   I recommend that you always bring your medications to each appointment as this makes it easy to ensure we are on the correct medications and helps [place] not miss when refills are needed.  Our plans for today:  - Follow-up next month for weight loss counseling - We are checking some labs. We will call you or comment on MyChart  Take care and seek immediate care sooner if you develop any concerns. Please remember to show up 15 minutes before your scheduled appointment time!  Tiffany Kocher, DO [facility]

## 2022-11-20 NOTE — Assessment & Plan Note (Signed)
Patient reports no A1c for over a year, unable to find in chart. Compliant on metformin twice daily, but can't remember if dose is 500 or 1000 mg BID, she will call to confirm. -A1C today -Continue metformin -Recommend UACR at next visit -Has routine eye-care

## 2022-11-20 NOTE — Progress Notes (Signed)
    SUBJECTIVE:   Chief compliant/HPI: annual examination  [name] is a 27 y.o. who presents today for an annual exam. Would like appointment for weight loss counseling.  Review of systems negative.   Updated history tabs and problem list: added T2DM and Keratoconus to list and updated medication list.   OBJECTIVE:   BP 130/76   Pulse 92   Ht 5\' 5"  (1.651 m)   Wt 260 lb (117.9 kg)   SpO2 97%   BMI 43.27 kg/m    General: NAD, pleasant Cardio: RRR, no MRG.  Respiratory: CTAB, normal wob on RA GI: Abdomen is soft, not tender, not distended. BS present Skin: Warm and dry  ASSESSMENT/PLAN:   Assessment & Plan Annual visit for general adult medical examination without abnormal findings Blood pressure reviewed and at goal.  The patient currently uses Mirena (since 2019) for contraception. Folate recommended as appropriate, minimum of 400 mcg per day.  Advanced directives not discussed  Considered the following items based upon USPSTF recommendations: HIV testing: Done prior Hepatitis C: ordered Hepatitis B: discussed Syphilis if at high risk: N/A GC/CT Done prior visit Lipid panel (nonfasting or fasting) discussed based upon AHA recommendations and not ordered.  Consider repeat every 4-6 years.  Reviewed risk factors for latent tuberculosis and not indicated Cervical cancer screening: prior Pap reviewed, repeat due in 2027 Immunizations up to date   Follow up in 1 year or sooner if indicated.  Type 2 diabetes mellitus without complication, without long-term current use of insulin (HCC) Patient reports no A1c for over a year, unable to find in chart. Compliant on metformin twice daily, but can't remember if dose is 500 or 1000 mg BID, she will call to confirm. -A1C today -Continue metformin -Recommend UACR at next visit -Has routine eye-care Class 3 severe obesity due to excess calories without serious comorbidity with body mass index (BMI) of 40.0 to 44.9 in  adult [facility]) Scheduled for follow-up to discuss weight loss counseling.   Follow-up recommendations UACR, confirm Eye eval Verify tobacco use   Tiffany Kocher, DO [facility]

## 2022-11-21 LAB — HEPATITIS C ANTIBODY: Hep C Virus Ab: NONREACTIVE

## 2022-12-08 ENCOUNTER — Encounter: Payer: Self-pay | Admitting: Student

## 2022-12-08 ENCOUNTER — Ambulatory Visit (INDEPENDENT_AMBULATORY_CARE_PROVIDER_SITE_OTHER): Payer: Medicaid Other | Admitting: Student

## 2022-12-08 VITALS — BP 125/94 | HR 93 | Ht 65.0 in | Wt 265.0 lb

## 2022-12-08 DIAGNOSIS — Z7984 Long term (current) use of oral hypoglycemic drugs: Secondary | ICD-10-CM | POA: Diagnosis present

## 2022-12-08 DIAGNOSIS — Z6841 Body Mass Index (BMI) 40.0 and over, adult: Secondary | ICD-10-CM | POA: Diagnosis not present

## 2022-12-08 DIAGNOSIS — E66813 Obesity, class 3: Secondary | ICD-10-CM

## 2022-12-08 DIAGNOSIS — E119 Type 2 diabetes mellitus without complications: Secondary | ICD-10-CM

## 2022-12-08 MED ORDER — SEMAGLUTIDE(0.25 OR 0.5MG/DOS) 2 MG/3ML ~~LOC~~ SOPN
PEN_INJECTOR | SUBCUTANEOUS | 0 refills | Status: DC
Start: 2022-12-08 — End: 2023-01-20

## 2022-12-08 NOTE — Assessment & Plan Note (Signed)
Patient like to discuss weight loss management.  Discussed lifestyle modifications, and set smart goals as below.  Additionally, GLP-1 has been started for her diabetes which will likely assist in her weight loss. - Exercise for 20 minutes, 3 days/week - Reduce caloric drink intake by 50%, and replace with water

## 2022-12-08 NOTE — Progress Notes (Signed)
    SUBJECTIVE:   CHIEF COMPLAINT / HPI:   Diabetes  weight loss Patient reports she struggled with weight since childhood.  Stress and family dynamics contribute to her weight gain.  Food is love language in her family and culture.  She is compliant with her metformin 1000 mg twice daily.  A1c still out of goal, and we discussed this.  She would like weight loss counseling, including lifestyle modifications and medication.  OBJECTIVE:   BP (!) 125/94   Pulse 93   Ht 5\' 5"  (1.651 m)   Wt 265 lb (120.2 kg)   SpO2 97%   BMI 44.10 kg/m    General: NAD, well-appearing, well-nourished Respiratory: No respiratory distress, breathing comfortably, able to speak in full sentences Skin: warm and dry, no rashes noted on exposed skin Psych: Appropriate affect and mood  ASSESSMENT/PLAN:   Assessment & Plan Type 2 diabetes mellitus without complication, without long-term current use of insulin (HCC) Hemoglobin A1c 8.1, despite metformin compliance.  Patient notices blood sugars are greater than 200 in the evening.  Start GLP-1, no contraindications identified and risks/benefits were discussed.  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  Discussed lifestyle modifications, and set smart goals as below.  Additionally, GLP-1 has been started for her diabetes which will likely assist in her weight loss. - Exercise for 20 minutes, 3 days/week - Reduce caloric drink intake by 50%, and replace with water  Follow-up recommendations Recheck blood pressure, if elevated consider blood pressure management.  Tiffany Kocher, DO [facility]

## 2022-12-08 NOTE — Patient Instructions (Signed)
It was great to see you! Thank you for allowing me to participate in your care!   I recommend that you always bring your medications to each appointment as this makes it easy to ensure we are on the correct medications and helps [name] not miss when refills are needed.  Our plans for today:  - Take 0.25 mg of Ozempic weekly for 4 weeks, then increase to 0.5 mg weekly for 4 weeks - Follow-up in 1 month  GOALS:  Exercise for 20 minutes, 3 days per week.  Reduce caloric drinks by 50%  We are checking some labs today, I will call you if they are abnormal will send you a MyChart message or a letter if they are normal.  If you do not hear about your labs in the next 2 weeks please let [name] know.  Take care and seek immediate care sooner if you develop any concerns. Please remember to show up 15 minutes before your scheduled appointment time!  Tiffany Kocher, DO [facility]

## 2022-12-08 NOTE — Assessment & Plan Note (Addendum)
Hemoglobin A1c 8.1, despite metformin compliance.  Patient notices blood sugars are greater than 200 in the evening.  Start GLP-1, no contraindications identified and risks/benefits were discussed.  Sip - Start Ozempic 0.25 mg weekly for 4 weeks, titrate to 0.5 mg weekly for 4 weeks - UACR today - Follow-up in 1 month - Recheck A1c in 3 months - Due for diabetic eye exam, she has eye doctor for her keratoconus

## 2022-12-09 LAB — MICROALBUMIN / CREATININE URINE RATIO
Creatinine, Urine: 26.8 mg/dL
Microalb/Creat Ratio: <11 mg/g{creat} (ref 0–29)
Microalbumin, Urine: <3 ug/mL

## 2022-12-10 ENCOUNTER — Encounter: Payer: Self-pay | Admitting: Student

## 2022-12-11 ENCOUNTER — Telehealth: Payer: Self-pay

## 2022-12-11 NOTE — Telephone Encounter (Signed)
Completed PA info in [facility] Tracks for Ozempic 0.25 mg.  Confirmation number:  [id number]  Status pending.  Will recheck status in 24 hours. Veronda Prude, RN

## 2022-12-15 ENCOUNTER — Other Ambulatory Visit: Payer: Self-pay | Admitting: Student

## 2022-12-15 ENCOUNTER — Other Ambulatory Visit (HOSPITAL_COMMUNITY): Payer: Self-pay

## 2022-12-15 DIAGNOSIS — E119 Type 2 diabetes mellitus without complications: Secondary | ICD-10-CM

## 2022-12-15 NOTE — Telephone Encounter (Signed)
Initial request denied.   Resubmitted with chart notes from 12/11/2022.  Will await response.   Received approval from 12/15/2022 - 06/13/2023. PA # [id number]  Called pharmacy with approval.   Called patient and provided with update. Patient appreciative.   Veronda Prude, RN

## 2023-01-16 ENCOUNTER — Ambulatory Visit: Payer: Medicaid Other | Admitting: Student

## 2023-01-20 ENCOUNTER — Ambulatory Visit (INDEPENDENT_AMBULATORY_CARE_PROVIDER_SITE_OTHER): Payer: Medicaid Other | Admitting: Student

## 2023-01-20 ENCOUNTER — Encounter: Payer: Self-pay | Admitting: Student

## 2023-01-20 VITALS — BP 118/74 | HR 95 | Ht 65.0 in | Wt 266.4 lb

## 2023-01-20 DIAGNOSIS — E119 Type 2 diabetes mellitus without complications: Secondary | ICD-10-CM

## 2023-01-20 DIAGNOSIS — Z7984 Long term (current) use of oral hypoglycemic drugs: Secondary | ICD-10-CM

## 2023-01-20 DIAGNOSIS — Z6841 Body Mass Index (BMI) 40.0 and over, adult: Secondary | ICD-10-CM | POA: Diagnosis not present

## 2023-01-20 DIAGNOSIS — E66813 Obesity, class 3: Secondary | ICD-10-CM | POA: Diagnosis not present

## 2023-01-20 MED ORDER — SEMAGLUTIDE(0.25 OR 0.5MG/DOS) 2 MG/1.5ML ~~LOC~~ SOPN
0.5000 mg | PEN_INJECTOR | SUBCUTANEOUS | 0 refills | Status: AC
Start: 2023-01-20 — End: 2023-02-17

## 2023-01-20 NOTE — Progress Notes (Signed)
    SUBJECTIVE:   CHIEF COMPLAINT / HPI:   Diabetes  Obesity Patient presents for follow-up of diabetes and weight loss management.  On 11//2024 she was prescribed Ozempic.  Prior authorization was completed on 12/15/2022.  Patient reports she has taken 3 doses of Ozempic, tolerating medication well. Compliant on metformin and without side-effects.   She has started to increase physical activity, currently opting to walk to work (10 min) and then walk home taking the long way (20 minutes). Additionally she has cut out caloric drinks.  Additionally, patient had question regarding her son's medical condition.  I am the PCP of her son.  She is concerned because urologist is recommending a circumcision for a medical condition, and her husband disagrees with circumcision.  They share joint custody of son.  I discussed that I am not a legal expert and would recommend speaking with urologist about medical necessity as well as speaking with her lawyer to clarify consent process.  OBJECTIVE:   BP 118/74   Pulse 95   Ht 5\' 5"  (1.651 m)   Wt 266 lb 6.4 oz (120.8 kg)   SpO2 99%   BMI 44.33 kg/m    General: NAD, pleasant Cardio: RRR, no MRG. Cap Refill <2s. Respiratory: CTAB, normal wob on RA Skin: Warm and dry  ASSESSMENT/PLAN:   Assessment & Plan Type 2 diabetes mellitus without complication, without long-term current use of insulin (HCC) Last A1c 8.1, too soon to recheck.  Compliant with Ozempic and metformin, without side effects. - Recommended ophthalmology exam - Increase Ozempic to 0.5 mg weekly starting 01/30/2023 - Continue metformin 1000 mg twice daily - Follow-up in 1 month, recheck A1c then Class 3 severe obesity due to excess calories without serious comorbidity with body mass index (BMI) of 40.0 to 44.9 in adult [facility]) Making progress with lifestyle modifications.  Anticipate weight loss with Ozempic. - Follow-up next month     Tiffany Kocher, DO [facility]  Medicine Center

## 2023-01-20 NOTE — Assessment & Plan Note (Signed)
Making progress with lifestyle modifications.  Anticipate weight loss with Ozempic. - Follow-up next month

## 2023-01-20 NOTE — Assessment & Plan Note (Signed)
Last A1c 8.1, too soon to recheck.  Compliant with Ozempic and metformin, without side effects. - Recommended ophthalmology exam - Increase Ozempic to 0.5 mg weekly starting 01/30/2023 - Continue metformin 1000 mg twice daily - Follow-up in 1 month, recheck A1c then

## 2023-01-20 NOTE — Patient Instructions (Addendum)
It was great to see you! Thank you for allowing me to participate in your care!   I recommend that you always bring your medications to each appointment as this makes it easy to ensure we are on the correct medications and helps [place] not miss when refills are needed.  Our plans for today:  - Take your last dose of 0.25 mg on Friday 01/23/2023 - Start your next dose of 0.5 mg next Friday. You can finish your current pen, and I have sent a refill. -Follow-up in 1 month. We will check your A1C at that time.   Take care and seek immediate care sooner if you develop any concerns. Please remember to show up 15 minutes before your scheduled appointment time!  Tiffany Kocher, DO [facility] Family Medicine

## 2023-02-24 ENCOUNTER — Ambulatory Visit: Payer: Medicaid Other | Admitting: Student

## 2023-03-02 ENCOUNTER — Encounter: Payer: Self-pay | Admitting: Student

## 2023-03-02 ENCOUNTER — Ambulatory Visit: Payer: Medicaid Other | Admitting: Student

## 2023-03-02 VITALS — BP 124/86 | HR 97 | Ht 65.0 in | Wt 259.2 lb

## 2023-03-02 DIAGNOSIS — E66813 Obesity, class 3: Secondary | ICD-10-CM

## 2023-03-02 DIAGNOSIS — Z6841 Body Mass Index (BMI) 40.0 and over, adult: Secondary | ICD-10-CM | POA: Diagnosis not present

## 2023-03-02 DIAGNOSIS — E119 Type 2 diabetes mellitus without complications: Secondary | ICD-10-CM

## 2023-03-02 DIAGNOSIS — Z7985 Long-term (current) use of injectable non-insulin antidiabetic drugs: Secondary | ICD-10-CM | POA: Diagnosis present

## 2023-03-02 LAB — POCT GLYCOSYLATED HEMOGLOBIN (HGB A1C): HbA1c, POC (controlled diabetic range): 7 % (ref 0.0–7.0)

## 2023-03-02 MED ORDER — OZEMPIC (1 MG/DOSE) 2 MG/1.5ML ~~LOC~~ SOPN: 1.0000 mg | PEN_INJECTOR | SUBCUTANEOUS | 1 refills | Status: DC

## 2023-03-02 NOTE — Progress Notes (Signed)
    SUBJECTIVE:   CHIEF COMPLAINT / HPI:   T2DM  Weight loss Compliant on Ozempic, appetite fluctuates. Some nausea and cramping, but tolerating well. Wants to continue medication. She is working on diet modification.   Currently opting to walk to work (10 min) and then walk home taking the long way (20 minutes). Doing indoor activities like dancing, and she has starting weight lifting in addition to the walking.  OBJECTIVE:   BP 124/86   Pulse 97   Ht 5\' 5"  (1.651 m)   Wt 259 lb 3.2 oz (117.6 kg)   SpO2 99%   BMI 43.13 kg/m    General: NAD, pleasant Cardio: RRR, no MRG. Cap Refill <2s. Respiratory: CTAB, normal wob on RA  ASSESSMENT/PLAN:   Assessment & Plan Type 2 diabetes mellitus without complication, without long-term current use of insulin (HCC) A1c 7.0, compliant with Ozempic 0.5 mg weekly and metformin 1000 mg twice daily.  Anticipate improved A1c with weight loss and GLP-1. - Will titrate Ozempic to 1 mg starting next week - Decrease metformin to 1000 mg daily, to assist with effects.  If A1c continues to improve, consider discontinuation of metformin altogether. - Follow-up in 4 weeks to assess efficacy of increased Ozempic    Tiffany Kocher, DO [facility] Center

## 2023-03-02 NOTE — Assessment & Plan Note (Addendum)
A1c 7.0, compliant with Ozempic 0.5 mg weekly and metformin 1000 mg twice daily.  Anticipate improved A1c with weight loss and GLP-1. - Will titrate Ozempic to 1 mg starting next week - Decrease metformin to 1000 mg daily, to assist with effects.  If A1c continues to improve, consider discontinuation of metformin altogether. - Follow-up in 4 weeks to assess efficacy of increased Ozempic

## 2023-03-03 ENCOUNTER — Other Ambulatory Visit (HOSPITAL_COMMUNITY): Payer: Self-pay

## 2023-03-03 ENCOUNTER — Encounter: Payer: Self-pay | Admitting: Student

## 2023-03-03 ENCOUNTER — Telehealth: Payer: Self-pay

## 2023-03-03 NOTE — Telephone Encounter (Signed)
Pharmacy Patient Advocate Encounter   Received notification from PATIENT MESSAGES that prior authorization for OZEMPIC (1MG  DOSE) is required/requested.   Insurance verification completed.   The patient is insured through [facility] MEDICAID .   Per test claim: PA required; PA submitted to above mentioned insurance via [employer] Key/confirmation #/EOC [id number] W. Status is pending

## 2023-03-05 NOTE — Telephone Encounter (Signed)
Pharmacy Patient Advocate Encounter  Received notification from [facility] MEDICAID that Prior Authorization for [facility] has been APPROVED from 03/03/23 to 03/02/24

## 2023-04-02 ENCOUNTER — Ambulatory Visit: Payer: Self-pay | Admitting: Student

## 2023-04-03 ENCOUNTER — Ambulatory Visit (INDEPENDENT_AMBULATORY_CARE_PROVIDER_SITE_OTHER): Payer: 59 | Admitting: Student

## 2023-04-03 ENCOUNTER — Ambulatory Visit: Payer: 59

## 2023-04-03 DIAGNOSIS — E66813 Obesity, class 3: Secondary | ICD-10-CM | POA: Diagnosis not present

## 2023-04-03 DIAGNOSIS — E119 Type 2 diabetes mellitus without complications: Secondary | ICD-10-CM

## 2023-04-03 DIAGNOSIS — Z6841 Body Mass Index (BMI) 40.0 and over, adult: Secondary | ICD-10-CM

## 2023-04-03 MED ORDER — OZEMPIC (1 MG/DOSE) 2 MG/1.5ML ~~LOC~~ SOPN
1.0000 mg | PEN_INJECTOR | SUBCUTANEOUS | 1 refills | Status: DC
Start: 2023-04-03 — End: 2023-04-30

## 2023-04-03 MED ORDER — METFORMIN HCL 1000 MG PO TABS: 1000.0000 mg | ORAL_TABLET | Freq: Every day | ORAL | 1 refills | Status: AC

## 2023-04-03 NOTE — Assessment & Plan Note (Signed)
 Tolerating Ozempic + metformin.  Prior A1c 7.0, will be due for A1c in approximately 2 months. - Continue Ozempic 1 mg weekly - Continue metformin 1000 mg daily - Discussed need for ophthalmology exam

## 2023-04-03 NOTE — Progress Notes (Signed)
    SUBJECTIVE:   CHIEF COMPLAINT / HPI:   Obesity and T2DM Compliant with Ozempic.  Currently on 1 mg weekly, tolerating well.  Wants to continue Ozempic.  Additionally taking 1000 mg of metformin daily.  Diet: Trying to increase water intake and cut out caloric drinks Exercise: Doing small weight lifitng 2ce a week, and walking 15-20 min 3 times a week.   OBJECTIVE:   BP 110/80   Pulse 61   Ht 5\' 5"  (1.651 m)   Wt 261 lb 4 oz (118.5 kg)   SpO2 98%   BMI 43.47 kg/m    General: NAD, well-appearing, well-nourished Respiratory: No respiratory distress, breathing comfortably, able to speak in full sentences Skin: warm and dry, no rashes noted on exposed skin Psych: Appropriate affect and mood   ASSESSMENT/PLAN:   Assessment & Plan Type 2 diabetes mellitus without complication, without long-term current use of insulin (HCC) Tolerating Ozempic + metformin.  Prior A1c 7.0, will be due for A1c in approximately 2 months. - Continue Ozempic 1 mg weekly - Continue metformin 1000 mg daily - Discussed need for ophthalmology exam Class 3 severe obesity due to excess calories without serious comorbidity with body mass index (BMI) of 40.0 to 44.9 in adult (HCC) Weight continues to fluctuate, but overall downtrend. - Continue lifestyle modifications - Continue Ozempic   Tiffany Kocher, DO [facility] Medicine Center

## 2023-04-03 NOTE — Patient Instructions (Signed)
 It was great to see you! Thank you for allowing me to participate in your care!   I recommend that you always bring your medications to each appointment as this makes it easy to ensure we are on the correct medications and helps [place] not miss when refills are needed.  Our plans for today:  - Continue ozempic and metformin  Take care and seek immediate care sooner if you develop any concerns. Please remember to show up 15 minutes before your scheduled appointment time!  Tiffany Kocher, DO [facility] Family [facility]

## 2023-04-03 NOTE — Assessment & Plan Note (Signed)
 Weight continues to fluctuate, but overall downtrend. - Continue lifestyle modifications - Continue Ozempic

## 2023-04-29 ENCOUNTER — Other Ambulatory Visit: Payer: Self-pay | Admitting: Student

## 2023-04-29 DIAGNOSIS — E119 Type 2 diabetes mellitus without complications: Secondary | ICD-10-CM

## 2023-04-30 ENCOUNTER — Telehealth: Payer: Self-pay

## 2023-04-30 NOTE — Telephone Encounter (Signed)
 Patient LVM on nurse line regarding Ozempic prescription and PA. Per chart review, medication has been approved until 03/02/24.  Returned call to patient. She reports that pharmacy was able to get prescription to run through insurance with no issues.   Veronda Prude, RN

## 2023-06-07 IMAGING — CR DG CHEST 1V PORT
1 series · 1 of 1 positions shown · non-contrast
Comparison: Chest radiograph dated 04/04/2015

CLINICAL DATA: 25-year-old female with chest pain and shortness of
breath.

EXAM:
PORTABLE CHEST 1 VIEW

[w chest pa]
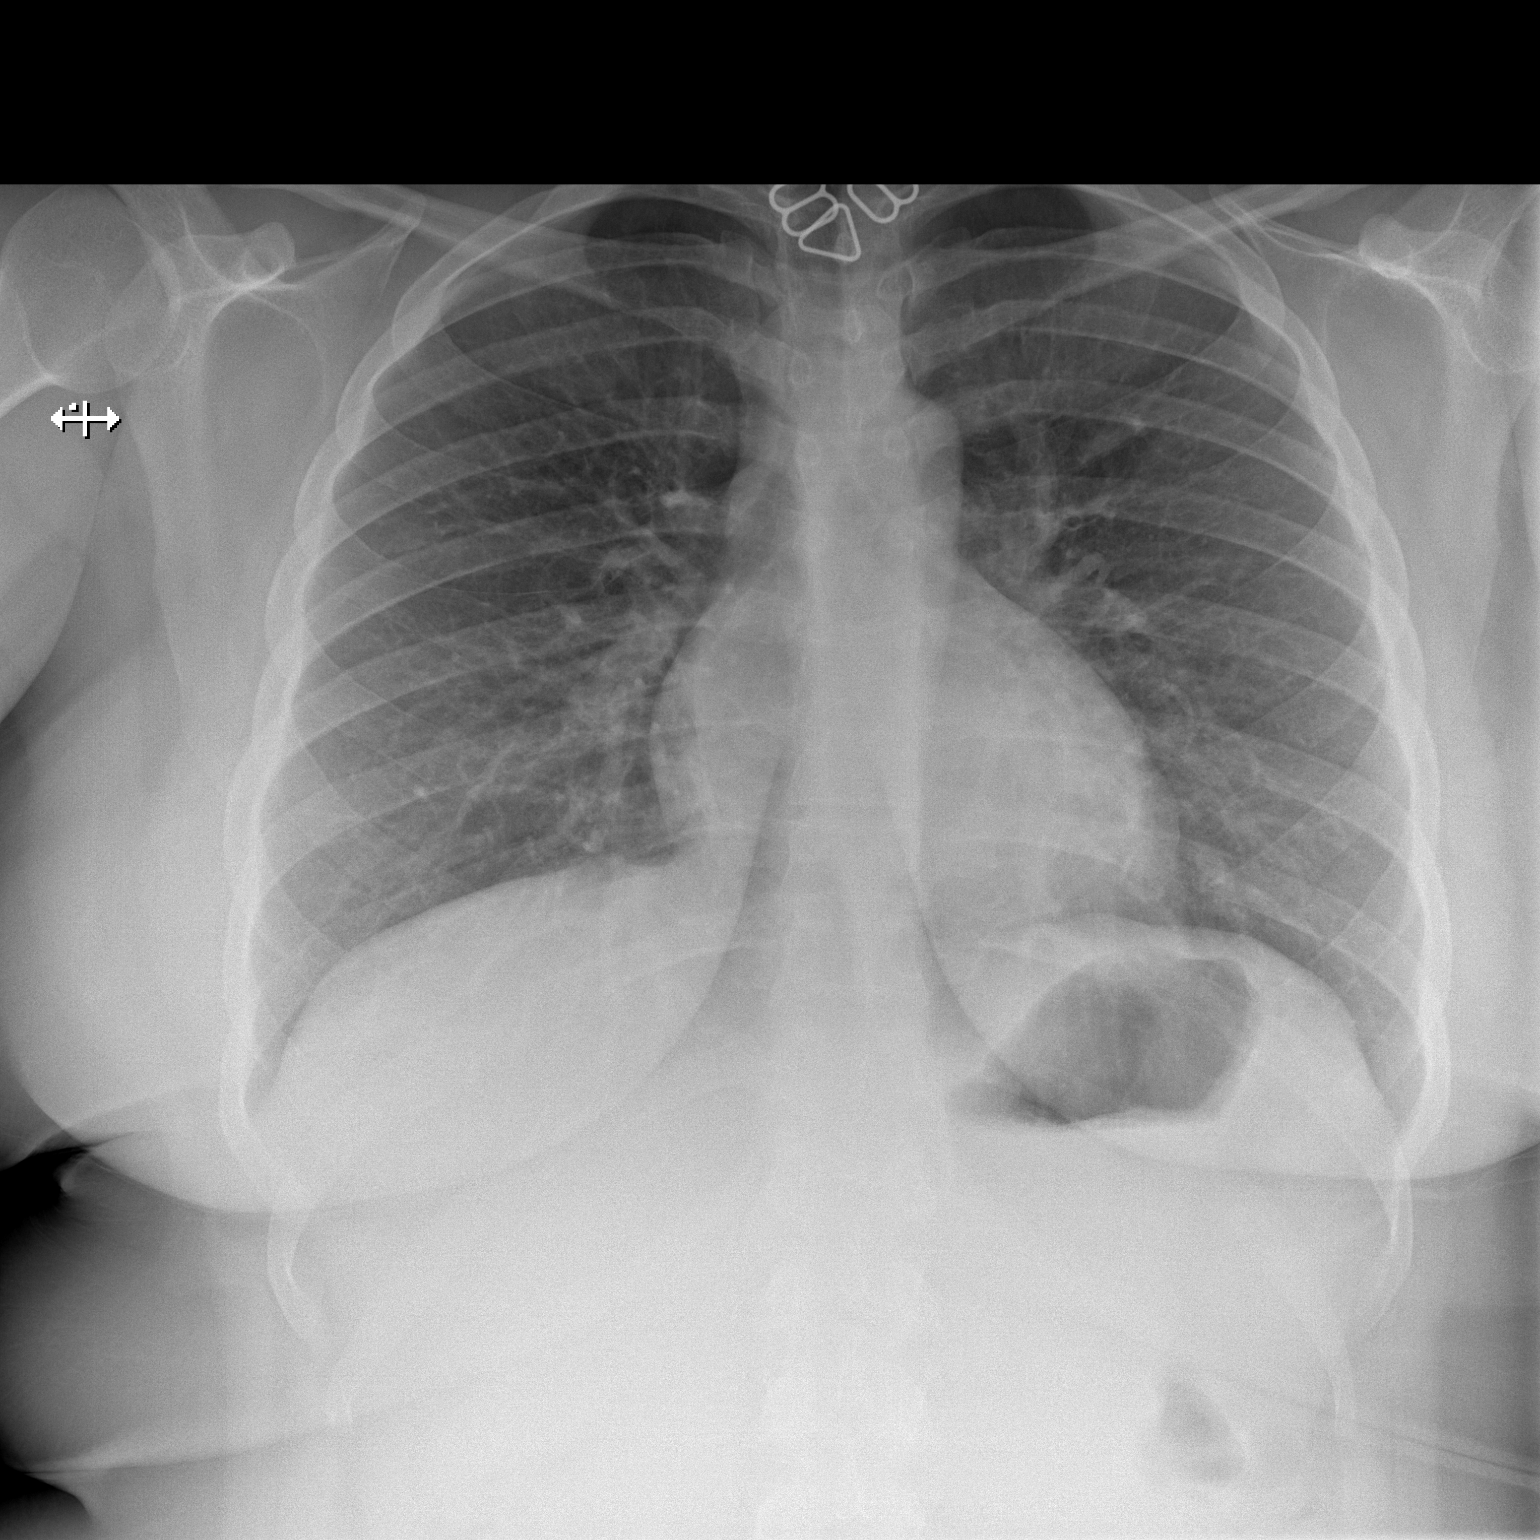

[1 of 1 positions shown; findings below may reference images not displayed]

FINDINGS: The heart size and mediastinal contours are within normal limits.
Both lungs are clear. The visualized skeletal structures are
unremarkable.
IMPRESSION: No active disease.

## 2023-06-27 ENCOUNTER — Other Ambulatory Visit: Payer: Self-pay | Admitting: Student

## 2023-06-27 DIAGNOSIS — E66813 Obesity, class 3: Secondary | ICD-10-CM

## 2023-06-27 DIAGNOSIS — E119 Type 2 diabetes mellitus without complications: Secondary | ICD-10-CM

## 2023-07-14 ENCOUNTER — Ambulatory Visit: Admitting: Student

## 2023-07-14 ENCOUNTER — Telehealth: Payer: Self-pay | Admitting: Student

## 2023-07-14 VITALS — BP 123/85 | HR 99 | Ht 65.0 in | Wt 260.1 lb

## 2023-07-14 DIAGNOSIS — Z6841 Body Mass Index (BMI) 40.0 and over, adult: Secondary | ICD-10-CM

## 2023-07-14 DIAGNOSIS — E66813 Obesity, class 3: Secondary | ICD-10-CM

## 2023-07-14 DIAGNOSIS — R300 Dysuria: Secondary | ICD-10-CM

## 2023-07-14 DIAGNOSIS — E119 Type 2 diabetes mellitus without complications: Secondary | ICD-10-CM

## 2023-07-14 DIAGNOSIS — N760 Acute vaginitis: Secondary | ICD-10-CM

## 2023-07-14 LAB — POCT UA - MICROSCOPIC ONLY

## 2023-07-14 LAB — POCT URINALYSIS DIP (MANUAL ENTRY)
Bilirubin, UA: NEGATIVE
Glucose, UA: NEGATIVE mg/dL
Ketones, POC UA: NEGATIVE mg/dL
Nitrite, UA: NEGATIVE
Protein Ur, POC: NEGATIVE mg/dL
Spec Grav, UA: 1.02 (ref 1.010–1.025)
Urobilinogen, UA: 0.2 U/dL
pH, UA: 7 (ref 5.0–8.0)

## 2023-07-14 LAB — POCT GLYCOSYLATED HEMOGLOBIN (HGB A1C): HbA1c, POC (controlled diabetic range): 6 % (ref 0.0–7.0)

## 2023-07-14 MED ORDER — FLUCONAZOLE 150 MG PO TABS
150.0000 mg | ORAL_TABLET | Freq: Once | ORAL | 0 refills | Status: AC
Start: 2023-07-14 — End: 2023-07-14

## 2023-07-14 NOTE — Progress Notes (Signed)
   SUBJECTIVE:   CHIEF COMPLAINT / HPI:   Obesity and T2DM Compliant with Ozempic , currently on 1 mg weekly and tolerating well.  Additionally taking 1000 mg metformin  daily.  She reports good compliance and minimal side-effects. Does have some heart-burn. Diet: Has cut out caloric drinks Exercise: Brisk walking, 15-30 min 5-7 days per week.  Vaginal discomfort Some mild dysuria, however itching around external vagina.  Malodorous, irritated skin, increased discharge.  Ongoing for approximately 1 week.  OBJECTIVE:   BP 123/85   Pulse 99   Ht 5\' 5"  (1.651 m)   Wt 260 lb 2 oz (118 kg)   SpO2 99%   BMI 43.29 kg/m    General: NAD, pleasant Cardio: RRR, no MRG. Cap Refill <2s. Respiratory: CTAB, normal wob on RA Skin: Warm and dry  ASSESSMENT/PLAN:   Assessment & Plan Type 2 diabetes mellitus without complication, without long-term current use of insulin (HCC) A1c today 6.0.  Well-controlled with Ozempic  and metformin .  Will increase Ozempic  with the hope of discontinuing metformin  while still maintaining A1c control to reduce medication burden. - Ozempic  2 mg weekly - Consider discontinuing metformin  - Follow-up in 3 months for A1c check Class 3 severe obesity due to excess calories without serious comorbidity with body mass index (BMI) of 40.0 to 44.9 in adult Will attempt Ozempic  titration for improved weight control.  Vulvovaginitis UA with leukocytes, but no other signs of infection.  Per patient's history, suspicious for vulvovaginitis.  Will attempt to treat with Diflucan.  If symptoms fail to improve, patient will come back for full GU exam.  GU exam was not performed today.    Lavada Porteous, DO [facility] Medicine Center

## 2023-07-14 NOTE — Patient Instructions (Addendum)
 It was great to see you! Thank you for allowing me to participate in your care!   I recommend that you always bring your medications to each appointment as this makes it easy to ensure we are on the correct medications and helps us  not miss when refills are needed.  Our plans for today:  - We will try to increase Ozempic  to 2 mg - Continue 1 mg Ozempic  until 2 mg is approved - Continue exercise and dieting   Take care and seek immediate care sooner if you develop any concerns. Please remember to show up 15 minutes before your scheduled appointment time!  Lavada Porteous, DO [facility]

## 2023-07-14 NOTE — Telephone Encounter (Signed)
 [facility] After Hours Line Phone Call Received after hours phone from patient. Called patient and confirmed name and DOB.   Patient reports she got her UA results back and she is concerned about the highlighted abnormal results. Reviewed office note from 6/10 with PCP and reviewed UA results. Clarified with patient the treatment plan and explained test results. All questions answered. Patient to follow up with PCP as needed.   Clem Currier, DO [facility], PGY-2 07/14/23 8:21 PM

## 2023-07-14 NOTE — Assessment & Plan Note (Signed)
 Will attempt Ozempic  titration for improved weight control.

## 2023-07-14 NOTE — Assessment & Plan Note (Addendum)
 A1c today 6.0.  Well-controlled with Ozempic  and metformin .  Will increase Ozempic  with the hope of discontinuing metformin  while still maintaining A1c control to reduce medication burden. - Ozempic  2 mg weekly - Consider discontinuing metformin  - Follow-up in 3 months for A1c check

## 2023-07-27 ENCOUNTER — Other Ambulatory Visit (HOSPITAL_COMMUNITY): Payer: Self-pay

## 2023-07-27 ENCOUNTER — Telehealth: Payer: Self-pay

## 2023-07-27 NOTE — Telephone Encounter (Signed)
 Pharmacy Patient Advocate Encounter   Received notification from Patient Pharmacy that prior authorization for OZEMPIC  1MG  DOSE PENS is required/requested.   Insurance verification completed.   The patient is insured through [facility] .   PA required; PA submitted to above mentioned insurance via CoverMyMeds Key/confirmation #/EOC [id number]. Status is pending

## 2023-07-27 NOTE — Telephone Encounter (Signed)
 Pharmacy Patient Advocate Encounter  Received notification from [facility] that Prior Authorization for OZEMPIC  1MG  DOSE PENS has been APPROVED from 07/27/23 to 07/27/26

## 2023-07-28 ENCOUNTER — Telehealth: Payer: Self-pay

## 2023-07-28 MED ORDER — OZEMPIC (2 MG/DOSE) 8 MG/3ML ~~LOC~~ SOPN: 2.0000 mg | PEN_INJECTOR | SUBCUTANEOUS | 1 refills | Status: DC

## 2023-07-28 NOTE — Telephone Encounter (Signed)
 Patient calls nurse line regarding questions with status of Ozempic  2 mg.   1 mg pen was recently approved through patient's insurance.   Patient states that she is fine to pick up and continue 1 mg until we are able to send and get 2 mg pen approved.   Will forward to PCP and [facility].   Chiquita JAYSON English, RN

## 2023-07-28 NOTE — Addendum Note (Signed)
 Addended by: HOWELL LUNGER on: 07/28/2023 02:56 PM   Modules accepted: Orders

## 2023-09-21 ENCOUNTER — Other Ambulatory Visit: Payer: Self-pay | Admitting: Student

## 2023-10-26 ENCOUNTER — Other Ambulatory Visit: Payer: Self-pay

## 2023-10-26 MED ORDER — OZEMPIC (2 MG/DOSE) 8 MG/3ML ~~LOC~~ SOPN: 2.0000 mg | PEN_INJECTOR | SUBCUTANEOUS | 1 refills | Status: DC

## 2023-11-10 ENCOUNTER — Ambulatory Visit (INDEPENDENT_AMBULATORY_CARE_PROVIDER_SITE_OTHER): Admitting: Student

## 2023-11-10 ENCOUNTER — Encounter: Payer: Self-pay | Admitting: Student

## 2023-11-10 VITALS — BP 108/82 | HR 97 | Ht 65.0 in | Wt 247.2 lb

## 2023-11-10 DIAGNOSIS — Z23 Encounter for immunization: Secondary | ICD-10-CM | POA: Diagnosis not present

## 2023-11-10 DIAGNOSIS — Z0000 Encounter for general adult medical examination without abnormal findings: Secondary | ICD-10-CM

## 2023-11-10 DIAGNOSIS — E119 Type 2 diabetes mellitus without complications: Secondary | ICD-10-CM | POA: Diagnosis not present

## 2023-11-10 LAB — POCT GLYCOSYLATED HEMOGLOBIN (HGB A1C): HbA1c, POC (controlled diabetic range): 5.5 % (ref 0.0–7.0)

## 2023-11-10 NOTE — Assessment & Plan Note (Signed)
 Previously well-controlled, would like to discontinue metformin  if A1c remains well-controlled. - A1c, UACR - Continue metformin  and Ozempic , consider discontinuation of metformin  pending A1c

## 2023-11-10 NOTE — Patient Instructions (Addendum)
 It was great to see you! Thank you for allowing me to participate in your care!   I recommend that you always bring your medications to each appointment as this makes it easy to ensure we are on the correct medications and helps us  not miss when refills are needed.  Our plans for today:  - Follow-up in 1 year for annual exam or sooner if needed - We are checking some labs today, I will call you if they are abnormal will send you a MyChart message or a letter if they are normal.  If you do not hear about your labs in the next 2 weeks please let us  know  Take care and seek immediate care sooner if you develop any concerns. Please remember to show up 15 minutes before your scheduled appointment time!  Gladis Church, DO [facility]

## 2023-11-10 NOTE — Progress Notes (Signed)
    SUBJECTIVE:   Chief compliant/HPI: annual examination  [name] is a 28 y.o. who presents today for an annual exam.   Updated history tabs and problem list.  OBJECTIVE:   BP 108/82   Pulse 97   Ht 5' 5 (1.651 m)   Wt 247 lb 4 oz (112.2 kg)   SpO2 99%   BMI 41.14 kg/m    General: NAD, pleasant Cardio: RRR, no MRG. Cap Refill <2s. Respiratory: CTAB, normal wob on RA Skin: Warm and dry  ASSESSMENT/PLAN:   Assessment & Plan Annual physical exam Blood pressure reviewed and at goal.  The patient currently uses IUD for contraception. Folate recommended as   Considered the following items based upon USPSTF recommendations: HIV testing:Previously done Hepatitis C: Previously done Hepatitis B: Ordered Syphilis if at high risk: Not high risk GC/CT declines Cervical cancer screening: prior Pap reviewed, repeat due in September 2027 Immunizations: Flu shot today MyChart Activation:Already signed up  Follow up in 1 year or sooner if indicated.  Type 2 diabetes mellitus without complication, without long-term current use of insulin (HCC) Previously well-controlled, would like to discontinue metformin  if A1c remains well-controlled. - A1c, UACR - Continue metformin  and Ozempic , consider discontinuation of metformin  pending A1c  Gladis Church, DO [facility] Medicine Center

## 2023-11-11 ENCOUNTER — Ambulatory Visit: Payer: Self-pay | Admitting: Student

## 2023-11-11 LAB — MICROALBUMIN / CREATININE URINE RATIO
Creatinine, Urine: 92.1 mg/dL
Microalb/Creat Ratio: 6 mg/g{creat} (ref 0–29)
Microalbumin, Urine: 5.2 ug/mL

## 2023-12-30 ENCOUNTER — Other Ambulatory Visit: Payer: Self-pay

## 2023-12-30 MED ORDER — OZEMPIC (2 MG/DOSE) 8 MG/3ML ~~LOC~~ SOPN
2.0000 mg | PEN_INJECTOR | SUBCUTANEOUS | 1 refills | Status: AC
  Filled 2023-12-30 – 2024-01-12 (×3): qty 3, 28d supply, fill #0
  Filled 2024-02-04 – 2024-02-08 (×2): qty 3, 28d supply, fill #1

## 2024-01-01 ENCOUNTER — Other Ambulatory Visit: Payer: Self-pay

## 2024-01-05 ENCOUNTER — Other Ambulatory Visit: Payer: Self-pay

## 2024-01-06 ENCOUNTER — Other Ambulatory Visit: Payer: Self-pay

## 2024-01-07 ENCOUNTER — Other Ambulatory Visit: Payer: Self-pay

## 2024-01-07 ENCOUNTER — Telehealth: Payer: Self-pay

## 2024-01-07 ENCOUNTER — Other Ambulatory Visit (HOSPITAL_COMMUNITY): Payer: Self-pay

## 2024-01-07 NOTE — Telephone Encounter (Signed)
 Attempted to submit PA for Ozempic  2mg  dose pens to [employer].   Per insurance: Your PA has been resolved, no additional PA is required. For further inquiries please contact the number on the back of the member prescription card.

## 2024-01-12 ENCOUNTER — Other Ambulatory Visit: Payer: Self-pay

## 2024-01-17 ENCOUNTER — Encounter: Payer: Self-pay | Admitting: Student

## 2024-01-25 ENCOUNTER — Ambulatory Visit: Payer: Self-pay | Admitting: Student

## 2024-02-01 ENCOUNTER — Ambulatory Visit (INDEPENDENT_AMBULATORY_CARE_PROVIDER_SITE_OTHER): Admitting: Student

## 2024-02-01 VITALS — BP 121/83 | HR 81 | Ht 65.0 in | Wt 245.2 lb

## 2024-02-01 DIAGNOSIS — Z23 Encounter for immunization: Secondary | ICD-10-CM | POA: Diagnosis not present

## 2024-02-01 DIAGNOSIS — M25551 Pain in right hip: Secondary | ICD-10-CM

## 2024-02-01 DIAGNOSIS — T8332XD Displacement of intrauterine contraceptive device, subsequent encounter: Secondary | ICD-10-CM

## 2024-02-01 DIAGNOSIS — M2550 Pain in unspecified joint: Secondary | ICD-10-CM

## 2024-02-01 NOTE — Progress Notes (Signed)
" ° ° °  SUBJECTIVE:   CHIEF COMPLAINT / HPI:   IUD concern Patient reports she was seen in the urgent care several weeks ago, x-rays obtained of her pelvis due to hip pain.  Per report (although I cannot see this in chart review), she was told that her IUD was above her pelvis.  Requesting IUD be checked.  Right hip pain Polyarthralgia Chronic history of arthralgia for many years.  Vague symptoms from locking of certain joints in her hand to morning stiffness.  Joint pain is intermittent and involves knees, ankles, hands and wrist.  Family history of MS, but no other known autoimmune diseases.  Had a recent injury at work while lifting, went to the urgent care diagnosed with muscle strain.  Patient reports that she was told that she had degenerative changes in her hip, however I am unable to see this x-ray.  She also reports chronic low back pain, which was exacerbated by lifting at work several weeks ago.  She reports an acute injury at work while lifting.  Since then she has had right hip/groin pain.  Nonradiating.  She is interested in seeing a specialty physician.  OBJECTIVE:   BP 121/83   Pulse 81   Ht 5' 5 (1.651 m)   Wt 245 lb 4 oz (111.2 kg)   SpO2 98%   BMI 40.81 kg/m    General: NAD, pleasant Cardio: RRR, no MRG. Respiratory: CTAB, normal wob on RA GI: Abdomen is soft, not tender, not distended. Right hip: No gross deformity, no ecchymosis, no swelling.  TTP over adductor muscles, lumbar paraspinal muscles.  FADIR positive for pain in anterior hip, FABER negative.  Straight leg negative.  Chaperone [name] CMA GU exam: IUD from cervical os identified.  ASSESSMENT/PLAN:   Assessment & Plan Right hip pain Polyarthralgia - Suspect acute right hip pain related to muscle strain from lifting at work, continue OTC analgesics - With vague polyarthralgia and reported  degenerative changes of bilateral hips will send to sports medicine for evaluation per patient  preference Displacement of intrauterine contraceptive device, subsequent encounter - IUD strings visualized in cervical os, low concern for displacement of IUD despite reported x-ray findings.    Selena Church, DO [facility] Center "

## 2024-02-01 NOTE — Patient Instructions (Signed)
 It was great to see you! Thank you for allowing me to participate in your care!   I recommend that you always bring your medications to each appointment as this makes it easy to ensure we are on the correct medications and helps us  not miss when refills are needed.  Our plans for today:  - You will receive a call from sports medicine - Your IUD strings are in place, which means it is likely in the uterus (where it should be).   Take care and seek immediate care sooner if you develop any concerns. Please remember to show up 15 minutes before your scheduled appointment time!  Gladis Church, DO [facility] Family Medicine

## 2024-02-05 ENCOUNTER — Other Ambulatory Visit: Payer: Self-pay

## 2024-02-09 ENCOUNTER — Other Ambulatory Visit: Payer: Self-pay

## 2024-02-24 ENCOUNTER — Telehealth

## 2024-02-24 ENCOUNTER — Telehealth: Admitting: Emergency Medicine

## 2024-02-24 DIAGNOSIS — K529 Noninfective gastroenteritis and colitis, unspecified: Secondary | ICD-10-CM | POA: Diagnosis not present

## 2024-02-24 NOTE — Progress Notes (Signed)
 " Virtual Visit Consent   [name], you are scheduled for a virtual visit with a [place] provider today. Just as with appointments in the office, your consent must be obtained to participate. Your consent will be active for this visit and any virtual visit you may have with one of our providers in the next 365 days. If you have a MyChart account, a copy of this consent can be sent to you electronically.  As this is a virtual visit, video technology does not allow for your provider to perform a traditional examination. This may limit your provider's ability to fully assess your condition. If your provider identifies any concerns that need to be evaluated in person or the need to arrange testing (such as labs, EKG, etc.), we will make arrangements to do so. Although advances in technology are sophisticated, we cannot ensure that it will always work on either your end or our end. If the connection with a video visit is poor, the visit may have to be switched to a telephone visit. With either a video or telephone visit, we are not always able to ensure that we have a secure connection.  By engaging in this virtual visit, you consent to the provision of healthcare and authorize for your insurance to be billed (if applicable) for the services provided during this visit. Depending on your insurance coverage, you may receive a charge related to this service.  I need to obtain your verbal consent now. Are you willing to proceed with your visit today? [name] has provided verbal consent on 02/24/2024 for a virtual visit (video or telephone). Jon CHRISTELLA Belt, NP  Date: 02/24/2024 1:36 PM   Virtual Visit via Video Note   I, Jon CHRISTELLA Belt, connected with  [name]  ([id number], [birth year]) on 02/24/24 at  1:30 PM EST by a video-enabled telemedicine application and verified that I am speaking with the correct person using two identifiers.  Location: Patient: Virtual Visit Location  Patient: Home Provider: Virtual Visit Location Provider: Home Office   I discussed the limitations of evaluation and management by telemedicine and the availability of in person appointments. The patient expressed understanding and agreed to proceed.    History of Present Illness: [name] is a 29 y.o. who identifies as a female who was assigned female at birth, and is being seen today for possible stomach flu. Ok yesterday until evening, then nausea in the evening. After dinner, had epigastric abd pain, nausea worse, vomit x1, diarrhea. Also had chills, no fever.   Feels ok except mild epigstric pain still. Pepto, fluids. Nausea still but no vomiting. Has not tried solid food yet.   Last diarrhea this morning at 7am.   Making light yellow urine, is hydrated.   Has DM2, does not monitor blood sugar at home. Does not feel sx of low blood sugar.    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  Resting comfortably  at home.  Head is normocephalic, atraumatic.  No labored breathing.  Speech is  clear and coherent with logical content.  Patient is alert and oriented at baseline.    Assessment and Plan: 1. Gastroenteritis (Primary)  Declines offer of antiemetic. Needs note for work. Plans to go back tomorrow. Will start eating solid food tonight starting with bland,simple foods.   Follow Up Instructions: I discussed the assessment and  treatment plan with the patient. The patient was provided an opportunity to ask questions and all were answered. The patient agreed with the plan and demonstrated an understanding of the instructions.  A copy of instructions were sent to the patient via MyChart unless otherwise noted below.   The patient was advised to call back or seek an in-person evaluation if the symptoms worsen or if the condition fails to improve as anticipated.    Jon CHRISTELLA Belt, NP    [1]  Allergies Allergen Reactions   Shrimp [Shellfish Allergy] Anaphylaxis   Amoxil [Amoxicillin] Other (See Comments)    Unknown reaction   Cleocin [Clindamycin] Hives   Vibramycin [Doxycycline] Rash  [2]  Current Outpatient Medications:    acetaminophen  (TYLENOL ) 500 MG tablet, Take 2 tablets (1,000 mg total) by mouth every 6 (six) hours as needed., Disp: , Rfl:    buPROPion  (WELLBUTRIN  XL) 300 MG 24 hr tablet, Take 1 tablet (300 mg total) by mouth daily. For mood, Disp: 30 tablet, Rfl: 0   hydrOXYzine  (ATARAX /VISTARIL ) 50 MG tablet, Take 1 tablet (50 mg total) by mouth 3 (three) times daily as needed for anxiety (sleep)., Disp: 60 tablet, Rfl: 0   ibuprofen  (ADVIL ) 200 MG tablet, Take 600 mg by mouth 2 (two) times daily as needed for headache, moderate pain or mild pain., Disp: , Rfl:    levonorgestrel (MIRENA) 20 MCG/DAY IUD, 1 each by Intrauterine route once., Disp: , Rfl:    metFORMIN  (GLUCOPHAGE ) 1000 MG tablet, Take 1 tablet (1,000 mg total) by mouth daily with breakfast., Disp: 90 tablet, Rfl: 1   prednisoLONE  acetate (PRED FORTE ) 1 % ophthalmic suspension, Place 1 drop into both eyes daily., Disp: , Rfl:    Semaglutide , 2 MG/DOSE, (OZEMPIC , 2 MG/DOSE,) 8 MG/3ML SOPN, Inject 2 mg into the skin once a week., Disp: 3 mL, Rfl: 1   sertraline  (ZOLOFT ) 50 MG tablet, Take 1 tablet (50 mg total) by mouth daily. For mood (Patient taking differently: Take 50 mg by mouth daily.), Disp: 30 tablet, Rfl: 0  "

## 2024-02-24 NOTE — Patient Instructions (Signed)
" °  [name], thank you for joining Selena CHRISTELLA Belt, NP for today's virtual visit.  While this provider is not your primary care provider (PCP), if your PCP is located in our provider database this encounter information will be shared with them immediately following your visit.   [name] gives you access to today's visit and all your visits, tests, and labs performed at [facility]  click here if you don't have [facility] [place] [facility] or go to [email or web address]  Consent: (Patient) [name] provided verbal consent for this virtual visit at the beginning of the encounter.  Current Medications:  Current Outpatient Medications:    acetaminophen  (TYLENOL ) 500 MG tablet, Take 2 tablets (1,000 mg total) by mouth every 6 (six) hours as needed., Disp: , Rfl:    buPROPion  (WELLBUTRIN  XL) 300 MG 24 hr tablet, Take 1 tablet (300 mg total) by mouth daily. For mood, Disp: 30 tablet, Rfl: 0   hydrOXYzine  (ATARAX /VISTARIL ) 50 MG tablet, Take 1 tablet (50 mg total) by mouth 3 (three) times daily as needed for anxiety (sleep)., Disp: 60 tablet, Rfl: 0   ibuprofen  (ADVIL ) 200 MG tablet, Take 600 mg by mouth 2 (two) times daily as needed for headache, moderate pain or mild pain., Disp: , Rfl:    levonorgestrel (MIRENA) 20 MCG/DAY IUD, 1 each by Intrauterine route once., Disp: , Rfl:    metFORMIN  (GLUCOPHAGE ) 1000 MG tablet, Take 1 tablet (1,000 mg total) by mouth daily with breakfast., Disp: 90 tablet, Rfl: 1   prednisoLONE  acetate (PRED FORTE ) 1 % ophthalmic suspension, Place 1 drop into both eyes daily., Disp: , Rfl:    Semaglutide , 2 MG/DOSE, (OZEMPIC , 2 MG/DOSE,) 8 MG/3ML SOPN, Inject 2 mg into the skin once Selena week., Disp: 3 mL, Rfl: 1   sertraline  (ZOLOFT ) 50 MG tablet, Take 1 tablet (50 mg total) by mouth daily. For mood (Patient taking differently: Take 50 mg by mouth daily.), Disp: 30 tablet, Rfl: 0   Medications ordered in this encounter:  No  orders of the defined types were placed in this encounter.    *If you need refills on other medications prior to your next appointment, please contact your pharmacy*  Follow-Up: Call back or seek an in-person evaluation if the symptoms worsen or if the condition fails to improve as anticipated.  [facility] [phone]  Other Instructions  Start eating bland, simple foods today to make sure you are ok before going back to work tomorrow.    If you have been instructed to have an in-person evaluation today at [facility] local [facility] facility, please use the link below. It will take you to Selena list of all of our available [facility], including address, phone number and hours of operation. Please do not delay care.  [facility]  If you or Selena family member do not have Selena primary care provider, use the link below to schedule Selena visit and establish care. When you choose [facility] primary care physician or advanced practice provider, you gain Selena long-term partner in health. Find Selena Primary Care Provider  Learn more about [facility] in-office and virtual care options: [facility] Get Care Now  "
